# Patient Record
Sex: Female | Born: 1975
Health system: Southern US, Community
[De-identification: ages and names within clinical notes are randomized; demographics above are authoritative.]

## PROBLEM LIST (undated history)

## (undated) DIAGNOSIS — R3129 Other microscopic hematuria: Secondary | ICD-10-CM

## (undated) DIAGNOSIS — R05 Cough: Secondary | ICD-10-CM

## (undated) DIAGNOSIS — J45909 Unspecified asthma, uncomplicated: Secondary | ICD-10-CM

## (undated) DIAGNOSIS — M79673 Pain in unspecified foot: Secondary | ICD-10-CM

## (undated) DIAGNOSIS — E079 Disorder of thyroid, unspecified: Secondary | ICD-10-CM

## (undated) DIAGNOSIS — R059 Cough, unspecified: Secondary | ICD-10-CM

## (undated) DIAGNOSIS — D229 Melanocytic nevi, unspecified: Secondary | ICD-10-CM

## (undated) DIAGNOSIS — R42 Dizziness and giddiness: Secondary | ICD-10-CM

## (undated) DIAGNOSIS — E063 Autoimmune thyroiditis: Secondary | ICD-10-CM

## (undated) DIAGNOSIS — E559 Vitamin D deficiency, unspecified: Secondary | ICD-10-CM

## (undated) DIAGNOSIS — R5383 Other fatigue: Secondary | ICD-10-CM

## (undated) DIAGNOSIS — F419 Anxiety disorder, unspecified: Secondary | ICD-10-CM

## (undated) DIAGNOSIS — E039 Hypothyroidism, unspecified: Secondary | ICD-10-CM

## (undated) DIAGNOSIS — J449 Chronic obstructive pulmonary disease, unspecified: Secondary | ICD-10-CM

## (undated) DIAGNOSIS — R109 Unspecified abdominal pain: Secondary | ICD-10-CM

## (undated) DIAGNOSIS — M25461 Effusion, right knee: Secondary | ICD-10-CM

## (undated) DIAGNOSIS — D2330 Other benign neoplasm of skin of unspecified part of face: Secondary | ICD-10-CM

## (undated) DIAGNOSIS — M25561 Pain in right knee: Secondary | ICD-10-CM

## (undated) DIAGNOSIS — E049 Nontoxic goiter, unspecified: Secondary | ICD-10-CM

## (undated) DIAGNOSIS — R350 Frequency of micturition: Secondary | ICD-10-CM

## (undated) DIAGNOSIS — R0602 Shortness of breath: Secondary | ICD-10-CM

## (undated) HISTORY — DX: Vitamin D deficiency, unspecified: E55.9

## (undated) HISTORY — DX: Other microscopic hematuria: R31.29

## (undated) HISTORY — DX: Pain in right knee: M25.461

## (undated) HISTORY — DX: Unspecified abdominal pain: R10.9

## (undated) HISTORY — DX: Shortness of breath: R06.02

## (undated) HISTORY — DX: Cough, unspecified: R05.9

## (undated) HISTORY — DX: Disorder of thyroid, unspecified: E07.9

## (undated) HISTORY — DX: Frequency of micturition: R35.0

## (undated) HISTORY — DX: Anxiety disorder, unspecified: F41.9

## (undated) HISTORY — DX: Other benign neoplasm of skin of unspecified part of face: D23.30

## (undated) HISTORY — DX: Dizziness and giddiness: R42

## (undated) HISTORY — DX: Other fatigue: R53.83

## (undated) HISTORY — PX: OOPHORECTOMY: SHX86

## (undated) HISTORY — DX: Pain in right knee: M25.561

## (undated) HISTORY — DX: Nontoxic goiter, unspecified: E04.9

## (undated) HISTORY — DX: Cough: R05

## (undated) HISTORY — DX: Melanocytic nevi, unspecified: D22.9

## (undated) HISTORY — PX: CHOLECYSTECTOMY: SHX55

## (undated) HISTORY — DX: Pain in unspecified foot: M79.673

## (undated) HISTORY — DX: Autoimmune thyroiditis: E06.3

## (undated) HISTORY — DX: Chronic obstructive pulmonary disease, unspecified: J44.9

## (undated) HISTORY — DX: Hypothyroidism, unspecified: E03.9

---

## 2011-04-25 DIAGNOSIS — F411 Generalized anxiety disorder: Secondary | ICD-10-CM | POA: Insufficient documentation

## 2011-04-25 DIAGNOSIS — E039 Hypothyroidism, unspecified: Secondary | ICD-10-CM | POA: Insufficient documentation

## 2011-10-27 DIAGNOSIS — G43909 Migraine, unspecified, not intractable, without status migrainosus: Secondary | ICD-10-CM | POA: Insufficient documentation

## 2011-10-27 DIAGNOSIS — K219 Gastro-esophageal reflux disease without esophagitis: Secondary | ICD-10-CM | POA: Insufficient documentation

## 2015-03-13 ENCOUNTER — Encounter: Payer: Self-pay | Admitting: Obstetrics and Gynecology

## 2015-03-13 ENCOUNTER — Ambulatory Visit (INDEPENDENT_AMBULATORY_CARE_PROVIDER_SITE_OTHER): Payer: Managed Care, Other (non HMO) | Admitting: Obstetrics and Gynecology

## 2015-03-13 VITALS — BP 108/71 | HR 84 | Resp 16 | Ht 64.0 in | Wt 175.0 lb

## 2015-03-13 DIAGNOSIS — R3129 Other microscopic hematuria: Secondary | ICD-10-CM

## 2015-03-13 DIAGNOSIS — R312 Other microscopic hematuria: Secondary | ICD-10-CM | POA: Diagnosis not present

## 2015-03-13 DIAGNOSIS — R350 Frequency of micturition: Secondary | ICD-10-CM | POA: Diagnosis not present

## 2015-03-13 LAB — MICROSCOPIC EXAMINATION: Epithelial Cells (non renal): >10 /hpf — ABNORMAL HIGH (ref 0–10)

## 2015-03-13 LAB — URINALYSIS, COMPLETE
Bilirubin, UA: NEGATIVE
Glucose, UA: NEGATIVE
Ketones, UA: NEGATIVE
LEUKOCYTES UA: NEGATIVE
NITRITE UA: NEGATIVE
PH UA: 5 (ref 5.0–7.5)
Protein, UA: NEGATIVE
RBC, UA: NEGATIVE
Specific Gravity, UA: >1.03 — ABNORMAL HIGH (ref 1.005–1.030)
Urobilinogen, Ur: 1 mg/dL (ref 0.2–1.0)

## 2015-03-13 LAB — BLADDER SCAN AMB NON-IMAGING

## 2015-03-13 NOTE — Progress Notes (Signed)
03/13/2015 10:54 AM   Meagan Savage 25-Jul-1975 [id number]  Referring provider: No referring provider defined for this encounter.  Chief Complaint  Patient presents with  . Urinary Frequency  . Establish Care    HPI: Patient is a 39 year old female presenting as a referral from her primary care provider for urinary frequency and microscopic hematuria.  1. Microscopic Hematuria- Per referral notes patient had multiple episodes of microscopic hematuria with negative urine cultures. No urinalysis were sent with referral notes. No gross hematuria.  2. Urinary Frequency/Incontinence- Patient reports symptoms of urinary frequency, small volume voids, mild dysuria, urge/stress incontinence and vaginal pressure 1 year. History of 3 pregnancies with 2 vaginal deliveries and one cesarean section. Patient reports daytime frequency approximately every 1 hour with urinary leakage when she is at work and is not able to void for up to 3 hours as well as urine leakage with cough. Associated symptoms include intermittent dysuria. Patient reports that she drinks very little water daily.   PMH: Past Medical History  Diagnosis Date  . Anxiety   . Cyst, dermoid, face   . Pain and swelling of right knee   . Goiter   . Cough   . Nevus   . Hypothyroidism   . Vitamin D deficiency   . Shortness of breath at rest   . Microscopic hematuria   . Fatigue   . Acute foot pain   . Abdominal pain   . Urinary frequency   . Dizziness     Surgical History: Past Surgical History  Procedure Laterality Date  . Cesarean section    . Cholecystectomy      Home Medications:    Medication List       This list is accurate as of: 03/13/15 10:54 AM.  Always use your most recent med list.               albuterol 108 (90 BASE) MCG/ACT inhaler  Commonly known as:  PROVENTIL HFA;VENTOLIN HFA  Inhale 2 puffs into the lungs every 6 (six) hours as needed for wheezing or shortness of breath.     ALPRAZolam 0.25 MG tablet  Commonly known as:  XANAX  Take 0.25 mg by mouth at bedtime as needed for anxiety.     levonorgestrel 20 MCG/24HR IUD  Commonly known as:  MIRENA  1 each by Intrauterine route once.     nicotine 21 mg/24hr patch  Commonly known as:  NICODERM CQ - dosed in mg/24 hours  Place 21 mg onto the skin daily.     sertraline 100 MG tablet  Commonly known as:  ZOLOFT  Take 100 mg by mouth daily.     SPIRIVA HANDIHALER 18 MCG inhalation capsule  Generic drug:  tiotropium  Place 18 mcg into inhaler and inhale daily.     thyroid 90 MG tablet  Commonly known as:  ARMOUR  Take 90 mg by mouth daily.     thyroid 60 MG tablet  Commonly known as:  ARMOUR  Take 60 mg by mouth daily before breakfast.        Allergies:  Allergies  Allergen Reactions  . Chantix [Varenicline] Nausea Only  . Levaquin [Levofloxacin In D5w] Other (See Comments)    arthralgia  . Penicillins Hives    Family History: Family History  Problem Relation Age of Onset  . Thyroid disease Mother   . Thyroid disease Sister   . COPD Mother   . COPD Father     Social History:  reports that she has been smoking Cigarettes.  She has been smoking about 0.75 packs per day. She does not have any smokeless tobacco history on file. She reports that she drinks alcohol. She reports that she does not use illicit drugs.  ROS: UROLOGY Frequent Urination?: Yes Hard to postpone urination?: Yes Burning/pain with urination?: Yes Get up at night to urinate?: No Leakage of urine?: Yes Urine stream starts and stops?: Yes Trouble starting stream?: Yes Do you have to strain to urinate?: Yes Blood in urine?: Yes Urinary tract infection?: No Sexually transmitted disease?: No Injury to kidneys or bladder?: No Painful intercourse?: No Weak stream?: No Currently pregnant?: No Vaginal bleeding?: No Last menstrual period?: n   Gastrointestinal Nausea?: No Vomiting?: No Indigestion/heartburn?:  No Diarrhea?: No Constipation?: No  Constitutional Fever: No Night sweats?: No Weight loss?: No Fatigue?: Yes  Skin Skin rash/lesions?: No Itching?: No  Eyes Blurred vision?: No Double vision?: No  Ears/Nose/Throat Sore throat?: No Sinus problems?: No  Hematologic/Lymphatic Swollen glands?: No Easy bruising?: Yes  Cardiovascular Leg swelling?: No Chest pain?: No  Respiratory Cough?: Yes Shortness of breath?: Yes  Endocrine Excessive thirst?: Yes  Musculoskeletal Back pain?: No Joint pain?: No  Neurological Headaches?: No Dizziness?: No  Psychologic Depression?: Yes Anxiety?: No  Physical Exam: BP 108/71 mmHg  Pulse 84  Resp 16  Ht 5\' 4"  (1.626 m)  Wt 175 lb (79.379 kg)  BMI 30.02 kg/m2  Constitutional:  Alert and oriented, No acute distress. HEENT: [place] AT, moist mucus membranes.  Trachea midline, no masses. Cardiovascular: No clubbing, cyanosis, or edema. Respiratory: Normal respiratory effort, no increased work of breathing. GI: Abdomen is soft, nontender, nondistended, no abdominal masses GU: No CVA tenderness.  No vaginal lesions or discharge. Normal urethral meatus. No urethral discharge, masses or tenderness. Normal anus and perineum.  Vaginal mucosa pink, with good moisture and normal rugae.  No tenderness or inflammation. Grade 1-2 cystocele with Valsalva, Grade 1 rectocele Minimal urethral hypermobility without demonstrable SUI.   Cervix present and normal in appearance.  No discharge or motion tenderness. No adnexal tenderness or palpable masses.   Skin: No rashes, bruises or suspicious lesions. Lymph: No cervical or inguinal adenopathy. Neurologic: Grossly intact, no focal deficits, moving all 4 extremities. Psychiatric: Normal mood and affect.  Laboratory Data: No results found for: WBC, HGB, HCT, MCV, PLT  No results found for: CREATININE  No results found for: PSA  No results found for: TESTOSTERONE  No results found  for: HGBA1C  Urinalysis No results found for: COLORURINE, APPEARANCEUR, LABSPEC, PHURINE, GLUCOSEU, HGBUR, BILIRUBINUR, KETONESUR, PROTEINUR, UROBILINOGEN, NITRITE, LEUKOCYTESUR  Pertinent Imaging:   Assessment & Plan:    1. Urinary frequency- Urinary frequency upon approximately every 1 hour daily with associated urge incontinence when she is unable to void for 2-3 hours. Symptoms consistent with overactive bladder. PVR 103 ML's a day. We'll start trial of Vesicare and have patient return in 2 weeks for nurse visit PVR. Patient does report Baseline constipation. I suggested patient take daily stool softeners as well as increase her water intake to improve bowel function. Moderate bacteria seen on urinalysis.  Urine sent for culture. I will have her come back in 1 month for recheck. - Urinalysis, Complete - Urine Culture - BLADDER SCAN AMB NON-IMAGING  2. Microscopic Hematuria- Per referral notes patient had multiple episodes of microscopic hematuria with negative urine cultures. No urinalysis were sent with referral notes. No microscopic hematuria on today's micro-urinalysis. I have requested notes from patient's  previous provider. Patient does have a history of smoking and will warrant a microscopic hematuria workup if previous  Micro urinalysis were positive.  3. Dysuria- I suspect intermittent dysuria related to patient's highly concentrated urine due to dehydration. I recommend significantly increasing her daily water intake. We'll recheck symptoms in 1 month.  4. Cystocele-  Grade 1-2 cystocele.  PVR 110ml.  Will continue to monitor for retention.  Patient may need to see Gyn in the future.  Additional notes and/or imaging study requested from PCP   Return for 2 weeks PVR nurse visit; 1 month with Ria Comment recheck OAB/micro heme/UAs requested from PCP.  Herbert Moors, [facility] [address], [place], [place] [address] [phone]

## 2015-03-15 LAB — CULTURE, URINE COMPREHENSIVE

## 2015-03-19 ENCOUNTER — Telehealth: Payer: Self-pay | Admitting: *Deleted

## 2015-03-19 NOTE — Telephone Encounter (Signed)
-----   Message from Roda Shutters, [place] sent at 03/19/2015  4:51 PM EDT ----- Please notify patient that her urine culture did show a small amount of bacteria though it is not clinically significant for urinary tract infection. If she is still continuing to have significant urinary symptoms she needs to come in and drop off another urine specimen to be sent for culture. Thanks

## 2015-03-19 NOTE — Telephone Encounter (Signed)
LMOM for patient to return call.

## 2015-03-20 ENCOUNTER — Other Ambulatory Visit: Payer: Managed Care, Other (non HMO)

## 2015-03-20 DIAGNOSIS — R300 Dysuria: Secondary | ICD-10-CM

## 2015-03-20 LAB — URINALYSIS, COMPLETE
BILIRUBIN UA: NEGATIVE
GLUCOSE, UA: NEGATIVE
KETONES UA: NEGATIVE
NITRITE UA: NEGATIVE
Protein, UA: NEGATIVE
RBC UA: NEGATIVE
SPEC GRAV UA: 1.025 (ref 1.005–1.030)
Urobilinogen, Ur: 1 mg/dL (ref 0.2–1.0)
pH, UA: 5.5 (ref 5.0–7.5)

## 2015-03-20 LAB — MICROSCOPIC EXAMINATION

## 2015-03-20 NOTE — Telephone Encounter (Signed)
Spoke with patient she is still burning she is coming in today around 2:00 to drop off another UA for culture.  Thanks, Sharyn Lull

## 2015-03-22 LAB — CULTURE, URINE COMPREHENSIVE

## 2015-03-27 ENCOUNTER — Ambulatory Visit (INDEPENDENT_AMBULATORY_CARE_PROVIDER_SITE_OTHER): Payer: Managed Care, Other (non HMO)

## 2015-03-27 ENCOUNTER — Telehealth: Payer: Self-pay

## 2015-03-27 DIAGNOSIS — N3281 Overactive bladder: Secondary | ICD-10-CM | POA: Diagnosis not present

## 2015-03-27 DIAGNOSIS — N390 Urinary tract infection, site not specified: Secondary | ICD-10-CM

## 2015-03-27 LAB — BLADDER SCAN AMB NON-IMAGING: SCAN RESULT: 116

## 2015-03-27 MED ORDER — SULFAMETHOXAZOLE-TRIMETHOPRIM 800-160 MG PO TABS: 1.0000 | ORAL_TABLET | Freq: Two times a day (BID) | ORAL | Status: AC

## 2015-03-27 NOTE — Progress Notes (Signed)
Bladder Scan: 116 Patient can void: amount not measured  Performed By: Toniann Fail, LPN   While pt was in office made aware of urine cx results. Medication called into pharmacy.

## 2015-03-27 NOTE — Telephone Encounter (Signed)
Pt came into office today for PVR. Made pt aware of results at that time. Medication called into pt pharmacy.

## 2015-04-16 ENCOUNTER — Ambulatory Visit (INDEPENDENT_AMBULATORY_CARE_PROVIDER_SITE_OTHER): Payer: Managed Care, Other (non HMO) | Admitting: Obstetrics and Gynecology

## 2015-04-16 ENCOUNTER — Encounter: Payer: Self-pay | Admitting: Obstetrics and Gynecology

## 2015-04-16 VITALS — BP 122/84 | HR 80 | Ht 64.0 in | Wt 173.3 lb

## 2015-04-16 DIAGNOSIS — R350 Frequency of micturition: Secondary | ICD-10-CM

## 2015-04-16 DIAGNOSIS — R3129 Other microscopic hematuria: Secondary | ICD-10-CM | POA: Diagnosis not present

## 2015-04-16 DIAGNOSIS — N39498 Other specified urinary incontinence: Secondary | ICD-10-CM

## 2015-04-16 LAB — URINALYSIS, COMPLETE
Bilirubin, UA: NEGATIVE
GLUCOSE, UA: NEGATIVE
KETONES UA: NEGATIVE
NITRITE UA: NEGATIVE
UUROB: 1 mg/dL (ref 0.2–1.0)
pH, UA: 5.5 (ref 5.0–7.5)

## 2015-04-16 LAB — MICROSCOPIC EXAMINATION
EPITHELIAL CELLS (NON RENAL): NONE SEEN /HPF (ref 0–10)
RENAL EPITHEL UA: NONE SEEN /HPF
WBC, UA: NONE SEEN /hpf (ref 0–?)

## 2015-04-16 LAB — BLADDER SCAN AMB NON-IMAGING

## 2015-04-16 NOTE — Progress Notes (Signed)
04/16/2015 8:55 AM   Meagan Savage 1975-07-07 [id number]  Referring provider: No referring provider defined for this encounter.  Chief Complaint  Patient presents with  . Urinary Frequency    HPI: Patient presents today for follow up on urinary frequency, incontinence and microscopic hematuria.  1. Microscopic Hematuria- Per referral notes patient had multiple episodes of microscopic hematuria with negative urine cultures. No urinalysis were sent with referral notes. No gross hematuria.  Lab results were requested but we have been unable to obtain previous UAs.  2. Urinary Frequency/Incontinence- Patient reports symptoms of urinary frequency, small volume voids, mild dysuria, urge/stress incontinence and vaginal pressure 1 year. History of 3 pregnancies with 2 vaginal deliveries and one cesarean section. Patient reports daytime frequency has significantly improved on Vesicare. Intermittent dysuria resolved. Patient reports that she drinks very little water daily.  She has continued to experience leakage while working long hours standing up. She does not leak when she is at home.  PMH: Past Medical History  Diagnosis Date  . Anxiety   . Cyst, dermoid, face   . Pain and swelling of right knee   . Goiter   . Cough   . Nevus   . Hypothyroidism   . Vitamin D deficiency   . Shortness of breath at rest   . Microscopic hematuria   . Fatigue   . Acute foot pain   . Abdominal pain   . Urinary frequency   . Dizziness     Surgical History: Past Surgical History  Procedure Laterality Date  . Cesarean section    . Cholecystectomy      Home Medications:    Medication List       This list is accurate as of: 04/16/15  8:55 AM.  Always use your most recent med list.               albuterol 108 (90 BASE) MCG/ACT inhaler  Commonly known as:  PROVENTIL HFA;VENTOLIN HFA  Inhale 2 puffs into the lungs every 6 (six) hours as needed for wheezing or shortness of breath.     ALPRAZolam 0.25 MG tablet  Commonly known as:  XANAX  Take 0.25 mg by mouth at bedtime as needed for anxiety.     levonorgestrel 20 MCG/24HR IUD  Commonly known as:  MIRENA  1 each by Intrauterine route once.     nicotine 21 mg/24hr patch  Commonly known as:  NICODERM CQ - dosed in mg/24 hours  Place 21 mg onto the skin daily.     sertraline 100 MG tablet  Commonly known as:  ZOLOFT  Take 100 mg by mouth daily.     solifenacin 10 MG tablet  Commonly known as:  VESICARE  Take by mouth daily.     SPIRIVA HANDIHALER 18 MCG inhalation capsule  Generic drug:  tiotropium  Place 18 mcg into inhaler and inhale daily.     thyroid 90 MG tablet  Commonly known as:  ARMOUR  Take 90 mg by mouth daily.        Allergies:  Allergies  Allergen Reactions  . Chantix [Varenicline] Nausea Only  . Levaquin [Levofloxacin In D5w] Other (See Comments)    arthralgia  . Penicillins Hives    Family History: Family History  Problem Relation Age of Onset  . Thyroid disease Mother   . Thyroid disease Sister   . COPD Mother   . COPD Father     Social History:  reports that she has been smoking  Cigarettes.  She has been smoking about 0.75 packs per day. She does not have any smokeless tobacco history on file. She reports that she drinks alcohol. She reports that she does not use illicit drugs.  ROS: UROLOGY Frequent Urination?: No Hard to postpone urination?: Yes Burning/pain with urination?: No Get up at night to urinate?: No Leakage of urine?: Yes Urine stream starts and stops?: No Trouble starting stream?: No Do you have to strain to urinate?: No Blood in urine?: No Urinary tract infection?: No Sexually transmitted disease?: No Injury to kidneys or bladder?: No Painful intercourse?: No Weak stream?: No Currently pregnant?: No Vaginal bleeding?: No Last menstrual period?: n  Gastrointestinal Nausea?: No Vomiting?: No Indigestion/heartburn?: No Diarrhea?:  No Constipation?: No  Constitutional Fever: No Night sweats?: No Weight loss?: No Fatigue?: No  Skin Skin rash/lesions?: No Itching?: No  Eyes Blurred vision?: No Double vision?: No  Ears/Nose/Throat Sore throat?: No Sinus problems?: No  Hematologic/Lymphatic Swollen glands?: No Easy bruising?: No  Cardiovascular Leg swelling?: No Chest pain?: No  Respiratory Cough?: No Shortness of breath?: No  Endocrine Excessive thirst?: No  Musculoskeletal Back pain?: No Joint pain?: No  Neurological Headaches?: No Dizziness?: No  Psychologic Depression?: No Anxiety?: No  Physical Exam: BP 122/84 mmHg  Pulse 80  Ht 5\' 4"  (1.626 m)  Wt 173 lb 4.8 oz (78.608 kg)  BMI 29.73 kg/m2  Constitutional:  Alert and oriented, No acute distress. HEENT: [place] AT, moist mucus membranes.  Trachea midline, no masses. Cardiovascular: No clubbing, cyanosis, or edema. Respiratory: Normal respiratory effort, no increased work of breathing. Skin: No rashes, bruises or suspicious lesions. Lymph: No cervical or inguinal adenopathy. Neurologic: Grossly intact, no focal deficits, moving all 4 extremities. Psychiatric: Normal mood and affect.  Laboratory Data: No results found for: WBC, HGB, HCT, MCV, PLT  No results found for: CREATININE  No results found for: PSA  No results found for: TESTOSTERONE  No results found for: HGBA1C  Urinalysis    Component Value Date/Time   GLUCOSEU Negative 03/20/2015 1408   BILIRUBINUR Negative 03/20/2015 1408   NITRITE Negative 03/20/2015 1408   LEUKOCYTESUR Trace* 03/20/2015 1408    Pertinent Imaging:   Assessment & Plan:    1. Urinary frequency/Incontinence-  Frequency improved on Vesicare. Continued incontinence while standing and working for long periods of time. UDS discussed for further evaluation of patient's leakage.  Patient would like to revisit in 6 months. - Urinalysis, Complete - BLADDER SCAN AMB NON-IMAGING  2.  Microscopic hematuria- We discussed the differential diagnosis for microscopic hematuria including nephrolithiasis, renal or upper tract tumors, bladder stones, UTIs, or bladder tumors as well as undetermined etiologies. Per AUA guidelines, I did recommend complete microscopic hematuria evaluation including CTU, possible urine cytology, and office cystoscopy.  We have been unable to obtain patient's previous UAs that were supposedly positive for hematuria.  Considering her smoking history we discussed the risk of possible malignancy being missed at this time.  Patient would like to pursue complete hematuria workup.  Return for f/u for CT results; cystoscopy.  Herbert Moors, [facility] [address] [place], [place] [address] [phone]

## 2015-04-17 LAB — PREGNANCY, URINE: PREG TEST UR: NEGATIVE

## 2015-04-24 ENCOUNTER — Ambulatory Visit
Admission: RE | Admit: 2015-04-24 | Discharge: 2015-04-24 | Disposition: A | Discharge status: Home / Self Care | Payer: Managed Care, Other (non HMO) | Source: Ambulatory Visit | Attending: Obstetrics and Gynecology | Admitting: Obstetrics and Gynecology

## 2015-04-24 DIAGNOSIS — R3129 Other microscopic hematuria: Secondary | ICD-10-CM | POA: Insufficient documentation

## 2015-04-24 DIAGNOSIS — N839 Noninflammatory disorder of ovary, fallopian tube and broad ligament, unspecified: Secondary | ICD-10-CM | POA: Diagnosis not present

## 2015-04-24 HISTORY — DX: Unspecified asthma, uncomplicated: J45.909

## 2015-04-24 MED ORDER — IOHEXOL 300 MG/ML  SOLN
150.0000 mL | Freq: Once | INTRAMUSCULAR | Status: AC | PRN
  Administered 2015-04-24: 150 mL via INTRAVENOUS

## 2015-05-01 ENCOUNTER — Encounter: Payer: Self-pay | Admitting: Urology

## 2015-05-01 ENCOUNTER — Ambulatory Visit (INDEPENDENT_AMBULATORY_CARE_PROVIDER_SITE_OTHER): Payer: Managed Care, Other (non HMO) | Admitting: Urology

## 2015-05-01 VITALS — BP 122/84 | HR 88 | Ht 64.0 in | Wt 179.8 lb

## 2015-05-01 DIAGNOSIS — N39498 Other specified urinary incontinence: Secondary | ICD-10-CM

## 2015-05-01 DIAGNOSIS — R350 Frequency of micturition: Secondary | ICD-10-CM

## 2015-05-01 DIAGNOSIS — Z720 Tobacco use: Secondary | ICD-10-CM

## 2015-05-01 DIAGNOSIS — R3129 Other microscopic hematuria: Secondary | ICD-10-CM

## 2015-05-01 DIAGNOSIS — N83201 Unspecified ovarian cyst, right side: Secondary | ICD-10-CM

## 2015-05-01 LAB — URINALYSIS, COMPLETE
BILIRUBIN UA: NEGATIVE
GLUCOSE, UA: NEGATIVE
KETONES UA: NEGATIVE
Leukocytes, UA: NEGATIVE
Nitrite, UA: NEGATIVE
PROTEIN UA: NEGATIVE
SPEC GRAV UA: 1.025 (ref 1.005–1.030)
UUROB: 1 mg/dL (ref 0.2–1.0)
pH, UA: 5.5 (ref 5.0–7.5)

## 2015-05-01 LAB — MICROSCOPIC EXAMINATION

## 2015-05-01 MED ORDER — LIDOCAINE HCL 2 % EX GEL
1.0000 "application " | Freq: Once | CUTANEOUS | Status: AC
  Administered 2015-05-01: 1 via URETHRAL

## 2015-05-01 MED ORDER — SULFAMETHOXAZOLE-TRIMETHOPRIM 800-160 MG PO TABS
1.0000 | ORAL_TABLET | Freq: Once | ORAL | Status: AC
  Administered 2015-05-01: 1 via ORAL

## 2015-05-01 NOTE — Progress Notes (Signed)
9:00 AM   Meagan Savage Aug 01, 1975 [id number]  Referring provider: No referring provider defined for this encounter.  Chief Complaint  Patient presents with  . Cysto    HPI: 39 year old female who presents today for cystoscopy for completion of her microscopic hematuria workup.  CT urogram was performed in the interim which shows a right ovarian cyst, otherwise unremarkable for any GU pathology.  She also has a history of urinary frequency and incontinence (mixed), and has been started on Vesicare. urinary frequency, incontinence and microscopic hematuria.    PMH: Past Medical History  Diagnosis Date  . Anxiety   . Cyst, dermoid, face   . Pain and swelling of right knee   . Goiter   . Cough   . Nevus   . Hypothyroidism   . Vitamin D deficiency   . Shortness of breath at rest   . Microscopic hematuria   . Fatigue   . Acute foot pain   . Abdominal pain   . Urinary frequency   . Dizziness   . Asthma     Surgical History: Past Surgical History  Procedure Laterality Date  . Cesarean section    . Cholecystectomy      Home Medications:    Medication List       This list is accurate as of: 05/01/15  9:00 AM.  Always use your most recent med list.               albuterol 108 (90 BASE) MCG/ACT inhaler  Commonly known as:  PROVENTIL HFA;VENTOLIN HFA  Inhale 2 puffs into the lungs every 6 (six) hours as needed for wheezing or shortness of breath.     ALPRAZolam 0.25 MG tablet  Commonly known as:  XANAX  Take 0.25 mg by mouth at bedtime as needed for anxiety.     levonorgestrel 20 MCG/24HR IUD  Commonly known as:  MIRENA  1 each by Intrauterine route once.     nicotine 21 mg/24hr patch  Commonly known as:  NICODERM CQ - dosed in mg/24 hours  Place 21 mg onto the skin daily.     sertraline 100 MG tablet  Commonly known as:  ZOLOFT  Take 100 mg by mouth daily.     solifenacin 10 MG tablet  Commonly known as:  VESICARE  Take by mouth daily.       SPIRIVA HANDIHALER 18 MCG inhalation capsule  Generic drug:  tiotropium  Place 18 mcg into inhaler and inhale daily.     thyroid 90 MG tablet  Commonly known as:  ARMOUR  Take 90 mg by mouth daily.        Allergies:  Allergies  Allergen Reactions  . Chantix [Varenicline] Nausea Only  . Levaquin [Levofloxacin In D5w] Other (See Comments)    arthralgia  . Penicillins Hives    Family History: Family History  Problem Relation Age of Onset  . Thyroid disease Mother   . Thyroid disease Sister   . COPD Mother   . COPD Father     Social History:  reports that she has been smoking Cigarettes.  She has been smoking about 0.75 packs per day. She does not have any smokeless tobacco history on file. She reports that she drinks alcohol. She reports that she does not use illicit drugs.   Physical Exam: BP 122/84 mmHg  Pulse 88  Ht 5\' 4"  (1.626 m)  Wt 179 lb 12.8 oz (81.557 kg)  BMI 30.85 kg/m2  Constitutional:  Alert and oriented, No acute distress. HEENT: [place] AT, moist mucus membranes.  Trachea midline, no masses. Cardiovascular: No clubbing, cyanosis, or edema. Respiratory: Normal respiratory effort, no increased work of breathing. GU: Normal external genitalia. Normal urethral meatus. Skin: No rashes, bruises or suspicious lesions. Neurologic: Grossly intact, no focal deficits, moving all 4 extremities. Psychiatric: Normal mood and affect.  Urinalysis    Component Value Date/Time   GLUCOSEU Negative 04/16/2015 0829   BILIRUBINUR Negative 04/16/2015 0829   NITRITE Negative 04/16/2015 0829   LEUKOCYTESUR Trace* 04/16/2015 0829    Pertinent Imaging: CLINICAL DATA: Dysuria for 2 months with urinary leakage for 2 years. Intrauterine device and cholecystectomy. Microscopic hematuria.  EXAM: CT ABDOMEN AND PELVIS WITHOUT AND WITH CONTRAST  TECHNIQUE: Multidetector CT imaging of the abdomen and pelvis was performed following the standard protocol before and  following the bolus administration of intravenous contrast.  CONTRAST: 143mL OMNIPAQUE IOHEXOL 300 MG/ML SOLN  COMPARISON: None.  FINDINGS: Lower chest: Bibasilar and lingular subsegmental atelectasis. Normal heart size without pericardial or pleural effusion.  Hepatobiliary: Normal liver. Cholecystectomy, without biliary ductal dilatation.  Pancreas: Normal, without mass or ductal dilatation.  Spleen: Normal in size, without focal abnormality.  Adrenals/Urinary Tract: Normal adrenal glands. No renal calculi or hydronephrosis. No hydroureter or ureteric calculi. No bladder calculi. No renal mass on post-contrast images. Moderate renal collecting system opacification on delayed images. Moderate ureteric opacification, without filling defect. No filling defect within the urinary bladder on moderately well opacified post-contrast images.  Stomach/Bowel: Normal stomach, without wall thickening. Normal colon and terminal ileum. Normal appendix (image 50, series 8). Normal small bowel.  Vascular/Lymphatic: Normal caliber of the aorta and branch vessels. No abdominopelvic adenopathy.  Reproductive: Intrauterine device. Multiple adjacent right ovarian simple cystic lesions versus 1 multi septated lesion. In conglomerate, measures 6.4 x 4.8 cm.  Other: No significant free fluid. cholecystectomy clip within the anterior left pelvis (image 51, series 8).  Musculoskeletal: No acute osseous abnormality.  IMPRESSION: 1. No acute process or explanation for hematuria. 2. Multiple right ovarian cysts versus 1 multiseptated cystic lesion. This warrants further evaluation with pelvic ultrasound.   Electronically Signed  By: Abigail Miyamoto M.D.  On: 04/24/2015 10:37           Vitals     Height Weight BMI (Calculated)    5\' 4"  (1.626 m) 179 lb 12.8 oz (81.557 kg) 30.9      Interpretation Summary     CLINICAL DATA: Dysuria for 2 months with  urinary leakage for 2 years. Intrauterine device and cholecystectomy. Microscopic hematuria.  EXAM: CT ABDOMEN AND PELVIS WITHOUT AND WITH CONTRAST  TECHNIQUE: Multidetector CT imaging of the abdomen and pelvis was performed following the standard protocol before and following the bolus administration of intravenous contrast.  CONTRAST: 151mL OMNIPAQUE IOHEXOL 300 MG/ML SOLN  COMPARISON: None.  FINDINGS: Lower chest: Bibasilar and lingular subsegmental atelectasis. Normal heart size without pericardial or pleural effusion.  Hepatobiliary: Normal liver. Cholecystectomy, without biliary ductal dilatation.  Pancreas: Normal, without mass or ductal dilatation.  Spleen: Normal in size, without focal abnormality.  Adrenals/Urinary Tract: Normal adrenal glands. No renal calculi or hydronephrosis. No hydroureter or ureteric calculi. No bladder calculi. No renal mass on post-contrast images. Moderate renal collecting system opacification on delayed images. Moderate ureteric opacification, without filling defect. No filling defect within the urinary bladder on moderately well opacified post-contrast images.  Stomach/Bowel: Normal stomach, without wall thickening. Normal colon and terminal ileum. Normal appendix (image 50, series 8). Normal small  bowel.  Vascular/Lymphatic: Normal caliber of the aorta and branch vessels. No abdominopelvic adenopathy.  Reproductive: Intrauterine device. Multiple adjacent right ovarian simple cystic lesions versus 1 multi septated lesion. In conglomerate, measures 6.4 x 4.8 cm.  Other: No significant free fluid. cholecystectomy clip within the anterior left pelvis (image 51, series 8).  Musculoskeletal: No acute osseous abnormality.  IMPRESSION: 1. No acute process or explanation for hematuria. 2. Multiple right ovarian cysts versus 1 multiseptated cystic lesion. This warrants further evaluation with pelvic  ultrasound.   Electronically Signed  By: Abigail Miyamoto M.D.  On: 04/24/2015 10:37      Cystoscopy Procedure Note  Patient identification was confirmed, informed consent was obtained, and patient was prepped using Betadine solution.  Lidocaine jelly was administered per urethral meatus.    Preoperative abx where received prior to procedure.    Procedure: - Flexible cystoscope introduced, without any difficulty.   - Thorough search of the bladder revealed:    normal urethral meatus    normal urothelium    no stones    no ulcers     no tumors    no urethral polyps    no trabeculation  - Ureteral orifices were normal in position and appearance.  Post-Procedure: - Patient tolerated the procedure well   Assessment & Plan:       1. Microscopic hematuria S/p negative hematuria work up including cystoscopy/ CTU - Urinalysis, Complete - sulfamethoxazole-trimethoprim (BACTRIM DS,SEPTRA DS) 800-160 MG per tablet 1 tablet; Take 1 tablet by mouth once. - lidocaine (XYLOCAINE) 2 % jelly 1 application; Place 1 application into the urethra once.  2. Right ovarian cyst Incidental finding on CT urogram. Recommend further workup with pelvic ultrasound and referral to OB/GYN. Findings discussed with patient. - Ambulatory referral to Obstetrics / Gynecology - US Pelvis Complete; Future  3. Tobacco abuse Discussed the importance of cessation of tobacco use.  4. Other urinary incontinence Plan for follow-up with mid-level provider for further treatment.  5. Urinary frequency As above  Return in about 6 weeks (around 06/12/2015) for with Ria Comment for voiding issues/ f/u pelvic ultrasound.  Hollice Espy, MD  [facility] [address], [place], [place] [address] [phone]

## 2015-05-01 NOTE — Patient Instructions (Signed)
Cystoscopy  Cystoscopy is a procedure that is used to help your caregiver diagnose and sometimes treat conditions that affect your lower urinary tract. Your lower urinary tract includes your bladder and the tube through which urine passes from your bladder out of your body (urethra). Cystoscopy is performed with a thin, tube-shaped instrument (cystoscope). The cystoscope has lenses and a light at the end so that your caregiver can see inside your bladder. The cystoscope is inserted at the entrance of your urethra. Your caregiver guides it through your urethra and into your bladder. There are two main types of cystoscopy:  · Flexible cystoscopy (with a flexible cystoscope).  · Rigid cystoscopy (with a rigid cystoscope).  Cystoscopy may be recommended for many conditions, including:  · Urinary tract infections.  · Blood in your urine (hematuria).  · Loss of bladder control (urinary incontinence) or overactive bladder.  · Unusual cells found in a urine sample.  · Urinary blockage.  · Painful urination.  Cystoscopy may also be done to remove a sample of your tissue to be checked under a microscope (biopsy). It may also be done to remove or destroy bladder stones.  LET YOUR CAREGIVER KNOW ABOUT:  · Allergies to food or medicine.  · Medicines taken, including vitamins, herbs, eyedrops, over-the-counter medicines, and creams.  · Use of steroids (by mouth or creams).  · Previous problems with anesthetics or numbing medicines.  · History of bleeding problems or blood clots.  · Previous surgery.  · Other health problems, including diabetes and kidney problems.  · Possibility of pregnancy, if this applies.  PROCEDURE  The area around the opening to your urethra will be cleaned. A medicine to numb your urethra (local anesthetic) is used. If a tissue sample or stone is removed during the procedure, you may be given a medicine to make you sleep (general anesthetic).  Your caregiver will gently insert the tip of the cystoscope  into your urethra. The cystoscope will be slowly glided through your urethra and into your bladder. Sterile fluid will flow through the cystoscope and into your bladder. The fluid will expand and stretch your bladder. This gives your caregiver a better view of your bladder walls. The procedure lasts about 15-20 minutes.  AFTER THE PROCEDURE  If a local anesthetic is used, you will be allowed to go home as soon as you are ready. If a general anesthetic is used, you will be taken to a recovery area until you are stable. You may have temporary bleeding and burning on urination.     This information is not intended to replace advice given to you by your health care provider. Make sure you discuss any questions you have with your health care provider.     Document Released: 05/30/2000 Document Revised: 06/23/2014 Document Reviewed: 11/24/2011  Elsevier Interactive Patient Education ©2016 [employer]

## 2015-05-09 ENCOUNTER — Telehealth: Payer: Self-pay | Admitting: Urology

## 2015-05-09 DIAGNOSIS — N83201 Unspecified ovarian cyst, right side: Secondary | ICD-10-CM

## 2015-05-09 NOTE — Telephone Encounter (Signed)
Order only

## 2015-06-13 ENCOUNTER — Ambulatory Visit: Payer: Managed Care, Other (non HMO) | Admitting: Obstetrics and Gynecology

## 2015-06-17 HISTORY — PX: OOPHORECTOMY: SHX86

## 2015-07-18 ENCOUNTER — Emergency Department: Payer: Managed Care, Other (non HMO)

## 2015-07-18 ENCOUNTER — Emergency Department
Admission: EM | Admit: 2015-07-18 | Discharge: 2015-07-18 | Disposition: A | Discharge status: Home / Self Care | Payer: Managed Care, Other (non HMO) | Attending: Emergency Medicine | Admitting: Emergency Medicine

## 2015-07-18 ENCOUNTER — Encounter: Payer: Self-pay | Admitting: Emergency Medicine

## 2015-07-18 DIAGNOSIS — H578 Other specified disorders of eye and adnexa: Secondary | ICD-10-CM | POA: Diagnosis present

## 2015-07-18 DIAGNOSIS — F17210 Nicotine dependence, cigarettes, uncomplicated: Secondary | ICD-10-CM | POA: Insufficient documentation

## 2015-07-18 DIAGNOSIS — H538 Other visual disturbances: Secondary | ICD-10-CM | POA: Insufficient documentation

## 2015-07-18 LAB — BASIC METABOLIC PANEL
ANION GAP: 4 — AB (ref 5–15)
BUN: 10 mg/dL (ref 6–20)
CO2: 27 mmol/L (ref 22–32)
Calcium: 9.3 mg/dL (ref 8.9–10.3)
Chloride: 108 mmol/L (ref 101–111)
Creatinine, Ser: 0.79 mg/dL (ref 0.44–1.00)
GFR calc Af Amer: >60 mL/min (ref >60)
GFR calc non Af Amer: >60 mL/min (ref >60)
GLUCOSE: 100 mg/dL — AB (ref 65–99)
POTASSIUM: 3.6 mmol/L (ref 3.5–5.1)
Sodium: 139 mmol/L (ref 135–145)

## 2015-07-18 LAB — CBC
HEMATOCRIT: 42.2 % (ref 35.0–47.0)
Hemoglobin: 14.4 g/dL (ref 12.0–16.0)
MCH: 31.6 pg (ref 26.0–34.0)
MCHC: 34.1 g/dL (ref 32.0–36.0)
MCV: 92.7 fL (ref 80.0–100.0)
Platelets: 298 10*3/uL (ref 150–440)
RBC: 4.56 MIL/uL (ref 3.80–5.20)
RDW: 13.7 % (ref 11.5–14.5)
WBC: 7.8 10*3/uL (ref 3.6–11.0)

## 2015-07-18 NOTE — ED Notes (Signed)
Orders given to this RN by McLamb, RN after she spoke with Mariea Clonts, MD regarding patient's presenting c/o and triage assessment. Orders entered by this RN for: CT head, EKG, CBC, BMP.

## 2015-07-18 NOTE — ED Notes (Signed)
Blurred vision for about 2 weeks  Intermittent ..states to day sx's seem worse

## 2015-07-20 ENCOUNTER — Telehealth: Payer: Self-pay | Admitting: Emergency Medicine

## 2015-07-20 NOTE — ED Notes (Signed)
Called patient due to lwot to inquire about condition and follow up plans. Pt says she went to her doctor and they feel it may be t hyroid.

## 2015-08-22 DIAGNOSIS — E049 Nontoxic goiter, unspecified: Secondary | ICD-10-CM | POA: Insufficient documentation

## 2016-05-06 DIAGNOSIS — G5603 Carpal tunnel syndrome, bilateral upper limbs: Secondary | ICD-10-CM | POA: Insufficient documentation

## 2017-01-05 ENCOUNTER — Encounter: Payer: Self-pay | Admitting: Unknown Physician Specialty

## 2017-01-05 ENCOUNTER — Ambulatory Visit (INDEPENDENT_AMBULATORY_CARE_PROVIDER_SITE_OTHER): Payer: 59 | Admitting: Unknown Physician Specialty

## 2017-01-05 VITALS — BP 108/71 | HR 77 | Temp 98.8°F | Ht 63.8 in | Wt 147.7 lb

## 2017-01-05 DIAGNOSIS — E559 Vitamin D deficiency, unspecified: Secondary | ICD-10-CM

## 2017-01-05 DIAGNOSIS — Z7689 Persons encountering health services in other specified circumstances: Secondary | ICD-10-CM | POA: Diagnosis not present

## 2017-01-05 DIAGNOSIS — J449 Chronic obstructive pulmonary disease, unspecified: Secondary | ICD-10-CM | POA: Diagnosis not present

## 2017-01-05 DIAGNOSIS — D2330 Other benign neoplasm of skin of unspecified part of face: Secondary | ICD-10-CM

## 2017-01-05 DIAGNOSIS — E039 Hypothyroidism, unspecified: Secondary | ICD-10-CM | POA: Diagnosis not present

## 2017-01-05 NOTE — Progress Notes (Signed)
BP 108/71   Pulse 77   Temp 98.8 F (37.1 C)   Ht 5' 3.8" (1.621 m)   Wt 147 lb 11.2 oz (67 kg)   SpO2 99%   BMI 25.51 kg/m    Subjective:    [name], female    DOB: 1975-07-27, 41 y.o.   MRN: [id number]  [name] is a 41 y.o. female  Chief Complaint  Patient presents with  . Establish Care    pt states her thyroid is always up and down  . Cyst    pt states she has a knot on her chin she would like looked at, states it has been there for about 6 months    Pt is here to establish care.    Hypothyroid Thyroid issues for about 20 years.  States her thyroid has been up and down.  She went to a specialist when she was first diagnosed.  She would like to see a specialist due to the variability.    COPD Currently smokes 1/2-1 ppd.  She takes [place] daily and uses rescue inhaler about once a week.  Not ready to smoke at this time  Cyst Has a cyst on left chin for about 6 months and getting bigger  Depression screen [facility] 2/9 01/05/2017  Decreased Interest 0  Down, Depressed, Hopeless 0  PHQ - 2 Score 0  Altered sleeping 0  Tired, decreased energy 1  Change in appetite 1  Feeling bad or failure about yourself  1  Trouble concentrating 0  Moving slowly or fidgety/restless 0  Suicidal thoughts 0  PHQ-9 Score 3     Social History   Social History  . Marital status: Married    Spouse name: N/A  . Number of children: N/A  . Years of education: N/A   Occupational History  . Not on file.   Social History Main Topics  . Smoking status: Current Every Day Smoker    Packs/day: 1.00    Types: Cigarettes  . Smokeless tobacco: Never Used  . Alcohol use Yes     Comment: on rare occasion- maybe twice a year   . Drug use: No  . Sexual activity: Yes    Birth control/ protection: IUD   Other Topics Concern  . Not on file   Social History Narrative  . No narrative on file   Family History  Problem Relation Age of Onset  . COPD  Mother   . Lymphoma Mother   . Hyperlipidemia Mother   . Thyroid disease Mother   . COPD Father   . Cancer Father        blood  . Thyroid disease Sister   . Emphysema Paternal Grandfather   . Epilepsy Daughter    Past Medical History:  Diagnosis Date  . COPD (chronic obstructive pulmonary disease) ([place])   . Hashimoto's disease   . Thyroid disease    hypothyroid   Past Surgical History:  Procedure Laterality Date  . CHOLECYSTECTOMY     1995  . OOPHORECTOMY Right 2017       Relevant past medical, surgical, family and social history reviewed and updated as indicated. Interim medical history since our last visit reviewed. Allergies and medications reviewed and updated.  Review of Systems  Per HPI unless specifically indicated above     Objective:    BP 108/71   Pulse 77   Temp 98.8 F (37.1 C)   Ht 5' 3.8" (1.621 m)  Wt 147 lb 11.2 oz (67 kg)   SpO2 99%   BMI 25.51 kg/m   Wt Readings from Last 3 Encounters:  01/05/17 147 lb 11.2 oz (67 kg)    Physical Exam  Constitutional: She is oriented to person, place, and time. She appears well-developed and well-nourished. No distress.  HENT:  Head: Normocephalic and atraumatic.  Eyes: Conjunctivae and lids are normal. Right eye exhibits no discharge. Left eye exhibits no discharge. No scleral icterus.  Neck: Normal range of motion. Neck supple. No JVD present. Carotid bruit is not present.  Smooth and freely moveable nodule left chin under mandible  Cardiovascular: Normal rate, regular rhythm and normal heart sounds.   Pulmonary/Chest: Effort normal and breath sounds normal.  Abdominal: Normal appearance. There is no splenomegaly or hepatomegaly.  Musculoskeletal: Normal range of motion.  Neurological: She is alert and oriented to person, place, and time.  Skin: Skin is warm, dry and intact. No rash noted. No pallor.  Psychiatric: She has a normal mood and affect. Her behavior is normal. Judgment and thought content  normal.    No results found for this or any previous visit.    Assessment & Plan:   Problem List Items Addressed This Visit      Unprioritized   COPD, moderate ([place])    Normal Spirometry while on Spireva      Relevant Medications   tiotropium (SPIRIVA) 18 MCG inhalation capsule   albuterol (VENTOLIN HFA) 108 (90 Base) MCG/ACT inhaler   Cyst, dermoid, face - Primary   Relevant Orders   Ambulatory referral to Dermatology   Encounter for evaluation of COPD   Relevant Orders   Spirometry with Graph (Completed)   Hypothyroid    Refer to Endocrine per pt request.  Will check labs today      Relevant Medications   levothyroxine (SYNTHROID, LEVOTHROID) 150 MCG tablet   Other Relevant Orders   Ambulatory referral to Endocrinology   Thyroid Panel With TSH   Vitamin D deficiency    History of this.  Not taking Vit D at this time      Relevant Orders   VITAMIN D 25 Hydroxy (Vit-D Deficiency, Fractures)       Follow up plan: Return if symptoms worsen or fail to improve, for physical.

## 2017-01-05 NOTE — Assessment & Plan Note (Signed)
Normal Spirometry while on Spireva

## 2017-01-05 NOTE — Assessment & Plan Note (Addendum)
Refer to Endocrine per pt request.  Will check labs today

## 2017-01-05 NOTE — Assessment & Plan Note (Signed)
History of this.  Not taking Vit D at this time

## 2017-01-06 ENCOUNTER — Telehealth: Payer: Self-pay | Admitting: Unknown Physician Specialty

## 2017-01-06 LAB — VITAMIN D 25 HYDROXY (VIT D DEFICIENCY, FRACTURES): Vit D, 25-Hydroxy: 27.8 ng/mL — ABNORMAL LOW (ref 30.0–100.0)

## 2017-01-06 LAB — THYROID PANEL WITH TSH
Free Thyroxine Index: 2.8 (ref 1.2–4.9)
T3 Uptake Ratio: 29 % (ref 24–39)
T4, Total: 9.7 ug/dL (ref 4.5–12.0)
TSH: 0.061 u[IU]/mL — ABNORMAL LOW (ref 0.450–4.500)

## 2017-01-06 NOTE — Telephone Encounter (Signed)
Discussed with pt about TSH and Vit D.  TSH suppressed but rest of thyroid panel is normal.  Will hold at this dose and wait for Endocrine opinion.  Vit D a little low.  Will take extra Vitamin D.

## 2017-02-02 ENCOUNTER — Ambulatory Visit (INDEPENDENT_AMBULATORY_CARE_PROVIDER_SITE_OTHER): Payer: 59 | Admitting: Unknown Physician Specialty

## 2017-02-02 ENCOUNTER — Encounter: Payer: Self-pay | Admitting: Unknown Physician Specialty

## 2017-02-02 VITALS — BP 103/67 | HR 87 | Temp 98.7°F | Ht 63.8 in | Wt 149.0 lb

## 2017-02-02 DIAGNOSIS — Z720 Tobacco use: Secondary | ICD-10-CM

## 2017-02-02 DIAGNOSIS — Z0000 Encounter for general adult medical examination without abnormal findings: Secondary | ICD-10-CM | POA: Diagnosis not present

## 2017-02-02 DIAGNOSIS — K140 Glossitis: Secondary | ICD-10-CM

## 2017-02-02 DIAGNOSIS — E039 Hypothyroidism, unspecified: Secondary | ICD-10-CM

## 2017-02-02 DIAGNOSIS — J449 Chronic obstructive pulmonary disease, unspecified: Secondary | ICD-10-CM | POA: Diagnosis not present

## 2017-02-02 MED ORDER — NYSTATIN 100000 UNIT/ML MT SUSP: 5.0000 mL | Freq: Four times a day (QID) | OROMUCOSAL | 0 refills | Status: DC

## 2017-02-02 NOTE — Progress Notes (Signed)
BP 103/67   Pulse 87   Temp 98.7 F (37.1 C)   Ht 5' 3.8" (1.621 m)   Wt 149 lb (67.6 kg)   LMP  (LMP Unknown)   SpO2 98%   BMI 25.74 kg/m    Subjective:    [name], female    DOB: 04/06/1976, 41 y.o.   MRN: [id number]  [name] is a 41 y.o. female  Chief Complaint  Patient presents with  . Annual Exam   Tobacco She would like to quit smoking.  Would like to try patches.  Does not like Chantix.    COPD Recent flare from humidity.  Taking Spireva  Burning mouth History of yeast infections.    Social History   Social History  . Marital status: Married    Spouse name: N/A  . Number of children: N/A  . Years of education: N/A   Occupational History  . Not on file.   Social History Main Topics  . Smoking status: Current Every Day Smoker    Packs/day: 1.00    Types: Cigarettes  . Smokeless tobacco: Never Used  . Alcohol use Yes     Comment: on rare occasion- maybe twice a year   . Drug use: No  . Sexual activity: Yes    Birth control/ protection: IUD   Other Topics Concern  . Not on file   Social History Narrative  . No narrative on file   Family History  Problem Relation Age of Onset  . COPD Mother   . Lymphoma Mother   . Hyperlipidemia Mother   . Thyroid disease Mother   . COPD Father   . Cancer Father        blood  . Thyroid disease Sister   . Emphysema Paternal Grandfather   . Epilepsy Daughter    Past Medical History:  Diagnosis Date  . COPD (chronic obstructive pulmonary disease) ([place])   . Hashimoto's disease   . Thyroid disease    hypothyroid   Social History   Social History  . Marital status: Married    Spouse name: N/A  . Number of children: N/A  . Years of education: N/A   Occupational History  . Not on file.   Social History Main Topics  . Smoking status: Current Every Day Smoker    Packs/day: 1.00    Types: Cigarettes  . Smokeless tobacco: Never Used  . Alcohol use Yes     Comment:  on rare occasion- maybe twice a year   . Drug use: No  . Sexual activity: Yes    Birth control/ protection: IUD   Other Topics Concern  . Not on file   Social History Narrative  . No narrative on file    Relevant past medical, surgical, family and social history reviewed and updated as indicated. Interim medical history since our last visit reviewed. Allergies and medications reviewed and updated.  Review of Systems  Per HPI unless specifically indicated above     Objective:    BP 103/67   Pulse 87   Temp 98.7 F (37.1 C)   Ht 5' 3.8" (1.621 m)   Wt 149 lb (67.6 kg)   LMP  (LMP Unknown)   SpO2 98%   BMI 25.74 kg/m   Wt Readings from Last 3 Encounters:  02/02/17 149 lb (67.6 kg)  01/05/17 147 lb 11.2 oz (67 kg)    Physical Exam  Constitutional: She is oriented to person, place, and  time. She appears well-developed and well-nourished. No distress.  HENT:  Head: Normocephalic and atraumatic.  Mouth/Throat: She has dentures.  Some glossitis  Eyes: Conjunctivae and lids are normal. Right eye exhibits no discharge. Left eye exhibits no discharge. No scleral icterus.  Neck: Normal range of motion. Neck supple. No JVD present. Carotid bruit is not present.  Cardiovascular: Normal rate, regular rhythm and normal heart sounds.   Pulmonary/Chest: Effort normal and breath sounds normal.  Abdominal: Normal appearance. There is no splenomegaly or hepatomegaly.  Musculoskeletal: Normal range of motion.  Neurological: She is alert and oriented to person, place, and time.  Skin: Skin is warm, dry and intact. No rash noted. No pallor.  Psychiatric: She has a normal mood and affect. Her behavior is normal. Judgment and thought content normal.    Results for orders placed or performed in visit on 01/05/17  Thyroid Panel With TSH  Result Value Ref Range   TSH 0.061 (L) 0.450 - 4.500 uIU/mL   T4, Total 9.7 4.5 - 12.0 ug/dL   T3 Uptake Ratio 29 24 - 39 %   Free Thyroxine Index 2.8  1.2 - 4.9  VITAMIN D 25 Hydroxy (Vit-D Deficiency, Fractures)  Result Value Ref Range   Vit D, 25-Hydroxy 27.8 (L) 30.0 - 100.0 ng/mL      Assessment & Plan:   Problem List Items Addressed This Visit      Unprioritized   COPD, moderate ([facility])    Stable, continue present medications.        Hypothyroid    Check levels. Suppressed TSH last visit      Tobacco abuse     I have recommended absolute tobacco cessation. I have discussed various options available for assistance with tobacco cessation including over the counter methods (Nicotine gum, patch and lozenges). We also discussed prescription options (Chantix, Nicotine Inhaler / Nasal Spray). The pt is planning to try nicotine replacement products       Other Visit Diagnoses    Annual physical exam    -  Primary   Relevant Orders   VITAMIN D 25 Hydroxy (Vit-D Deficiency, Fractures)   TSH   Comprehensive metabolic panel   Lipid Panel w/o Chol/HDL Ratio   CBC with Differential/Platelet   Glossitis       Check B12.  rx for Nystatin   Relevant Orders   VITAMIN D 25 Hydroxy (Vit-D Deficiency, Fractures)   B12       Follow up plan: Return if symptoms worsen or fail to improve.

## 2017-02-02 NOTE — Assessment & Plan Note (Signed)
Stable, continue present medications.   

## 2017-02-02 NOTE — Assessment & Plan Note (Addendum)
Check levels. Suppressed TSH last visit

## 2017-02-02 NOTE — Assessment & Plan Note (Addendum)
I have recommended absolute tobacco cessation. I have discussed various options available for assistance with tobacco cessation including over the counter methods (Nicotine gum, patch and lozenges). We also discussed prescription options (Chantix, Nicotine Inhaler / Nasal Spray). The pt is planning to try nicotine replacement products

## 2017-02-03 ENCOUNTER — Encounter: Payer: Self-pay | Admitting: Unknown Physician Specialty

## 2017-02-03 ENCOUNTER — Telehealth: Payer: Self-pay | Admitting: Unknown Physician Specialty

## 2017-02-03 DIAGNOSIS — E039 Hypothyroidism, unspecified: Secondary | ICD-10-CM

## 2017-02-03 LAB — CBC WITH DIFFERENTIAL/PLATELET
BASOS ABS: 0 10*3/uL (ref 0.0–0.2)
Basos: 0 %
EOS (ABSOLUTE): 0.3 10*3/uL (ref 0.0–0.4)
Eos: 2 %
HEMOGLOBIN: 14.3 g/dL (ref 11.1–15.9)
Hematocrit: 42.9 % (ref 34.0–46.6)
IMMATURE GRANS (ABS): 0 10*3/uL (ref 0.0–0.1)
Immature Granulocytes: 0 %
LYMPHS: 17 %
Lymphocytes Absolute: 2.2 10*3/uL (ref 0.7–3.1)
MCH: 30.8 pg (ref 26.6–33.0)
MCHC: 33.3 g/dL (ref 31.5–35.7)
MCV: 93 fL (ref 79–97)
MONOCYTES: 5 %
Monocytes Absolute: 0.6 10*3/uL (ref 0.1–0.9)
Neutrophils Absolute: 9.5 10*3/uL — ABNORMAL HIGH (ref 1.4–7.0)
Neutrophils: 76 %
Platelets: 300 10*3/uL (ref 150–379)
RBC: 4.64 x10E6/uL (ref 3.77–5.28)
RDW: 13.6 % (ref 12.3–15.4)
WBC: 12.6 10*3/uL — AB (ref 3.4–10.8)

## 2017-02-03 LAB — COMPREHENSIVE METABOLIC PANEL
ALBUMIN: 4.5 g/dL (ref 3.5–5.5)
ALT: 9 IU/L (ref 0–32)
AST: 10 IU/L (ref 0–40)
Albumin/Globulin Ratio: 1.8 (ref 1.2–2.2)
Alkaline Phosphatase: 62 IU/L (ref 39–117)
BUN / CREAT RATIO: 9 (ref 9–23)
BUN: 7 mg/dL (ref 6–24)
Bilirubin Total: 0.4 mg/dL (ref 0.0–1.2)
CALCIUM: 9.4 mg/dL (ref 8.7–10.2)
CO2: 22 mmol/L (ref 20–29)
CREATININE: 0.74 mg/dL (ref 0.57–1.00)
Chloride: 104 mmol/L (ref 96–106)
GFR, EST AFRICAN AMERICAN: 115 mL/min/{1.73_m2} (ref >59)
GFR, EST NON AFRICAN AMERICAN: 100 mL/min/{1.73_m2} (ref >59)
GLOBULIN, TOTAL: 2.5 g/dL (ref 1.5–4.5)
Glucose: 97 mg/dL (ref 65–99)
Potassium: 4 mmol/L (ref 3.5–5.2)
SODIUM: 142 mmol/L (ref 134–144)
Total Protein: 7 g/dL (ref 6.0–8.5)

## 2017-02-03 LAB — VITAMIN D 25 HYDROXY (VIT D DEFICIENCY, FRACTURES): VIT D 25 HYDROXY: 24.9 ng/mL — AB (ref 30.0–100.0)

## 2017-02-03 LAB — TSH: TSH: 0.071 u[IU]/mL — AB (ref 0.450–4.500)

## 2017-02-03 LAB — LIPID PANEL W/O CHOL/HDL RATIO
Cholesterol, Total: 167 mg/dL (ref 100–199)
HDL: 44 mg/dL (ref >39)
LDL CALC: 107 mg/dL — AB (ref 0–99)
Triglycerides: 78 mg/dL (ref 0–149)
VLDL Cholesterol Cal: 16 mg/dL (ref 5–40)

## 2017-02-03 LAB — VITAMIN B12: VITAMIN B 12: 272 pg/mL (ref 232–1245)

## 2017-02-03 MED ORDER — LEVOTHYROXINE SODIUM 137 MCG PO TABS: 137.0000 ug | ORAL_TABLET | Freq: Every day | ORAL | 0 refills | Status: DC

## 2017-02-03 NOTE — Telephone Encounter (Signed)
Discussed TSH too low.  Will decrease Synthroid to 137 mcgs.  Recheck in 3 months

## 2017-02-03 NOTE — Progress Notes (Signed)
Patient notified of results by phone.

## 2017-03-23 ENCOUNTER — Encounter: Payer: Self-pay | Admitting: Family Medicine

## 2017-03-23 ENCOUNTER — Ambulatory Visit (INDEPENDENT_AMBULATORY_CARE_PROVIDER_SITE_OTHER): Payer: 59 | Admitting: Family Medicine

## 2017-03-23 VITALS — BP 105/69 | HR 74 | Temp 98.5°F | Wt 149.6 lb

## 2017-03-23 DIAGNOSIS — N760 Acute vaginitis: Secondary | ICD-10-CM | POA: Diagnosis not present

## 2017-03-23 DIAGNOSIS — N898 Other specified noninflammatory disorders of vagina: Secondary | ICD-10-CM | POA: Diagnosis not present

## 2017-03-23 DIAGNOSIS — B9689 Other specified bacterial agents as the cause of diseases classified elsewhere: Secondary | ICD-10-CM

## 2017-03-23 LAB — WET PREP FOR TRICH, YEAST, CLUE
Clue Cell Exam: POSITIVE — AB
TRICHOMONAS EXAM: NEGATIVE
YEAST EXAM: NEGATIVE

## 2017-03-23 MED ORDER — METRONIDAZOLE 500 MG PO TABS: 500.0000 mg | ORAL_TABLET | Freq: Two times a day (BID) | ORAL | 0 refills | Status: DC

## 2017-03-23 NOTE — Progress Notes (Signed)
   BP 105/69 (BP Location: Left Arm, Patient Position: Sitting, Cuff Size: Normal)   Pulse 74   Temp 98.5 F (36.9 C)   Wt 149 lb 9.6 oz (67.9 kg)   SpO2 96%   BMI 25.84 kg/m    Subjective:    [name], female    DOB: [birth year], 41 y.o.   MRN: [id number]  [name] is a 41 y.o. female  Chief Complaint  Patient presents with  . Vaginal Smell  . Vaginal Discharge  . Vaginal Itching  . Dysuria   Patient presents with strong vaginal odor after sex and some vaginal itching/irritation. Denies abnormal discharge, pelvic pain, urinary sxs, fever, lesions. Has had BV in the past with similar sxs. Not trying anything OTC at this time.   Relevant past medical, surgical, family and social history reviewed and updated as indicated. Interim medical history since our last visit reviewed. Allergies and medications reviewed and updated.  Review of Systems  Constitutional: Negative.   HENT: Negative.   Respiratory: Negative.   Cardiovascular: Negative.   Gastrointestinal: Negative.   Genitourinary:       Vaginal odor, itching  Musculoskeletal: Negative.   Neurological: Negative.   Psychiatric/Behavioral: Negative.    Per HPI unless specifically indicated above     Objective:    BP 105/69 (BP Location: Left Arm, Patient Position: Sitting, Cuff Size: Normal)   Pulse 74   Temp 98.5 F (36.9 C)   Wt 149 lb 9.6 oz (67.9 kg)   SpO2 96%   BMI 25.84 kg/m   Wt Readings from Last 3 Encounters:  03/23/17 149 lb 9.6 oz (67.9 kg)  02/02/17 149 lb (67.6 kg)  01/05/17 147 lb 11.2 oz (67 kg)    Physical Exam  Constitutional: She is oriented to person, place, and time. She appears well-developed and well-nourished. No distress.  HENT:  Head: Atraumatic.  Eyes: Pupils are equal, round, and reactive to light. Conjunctivae are normal.  Neck: Normal range of motion.  Cardiovascular: Normal rate.   Pulmonary/Chest: Effort normal and breath sounds normal. No  respiratory distress.  Abdominal: Soft. Bowel sounds are normal. There is no tenderness.  Genitourinary: Vaginal discharge (minimal, thin white) found.  Musculoskeletal: Normal range of motion.  Neurological: She is alert and oriented to person, place, and time.  Skin: Skin is warm and dry.  Psychiatric: She has a normal mood and affect. Her behavior is normal.  Nursing note and vitals reviewed.     Assessment & Plan:   Problem List Items Addressed This Visit    None    Visit Diagnoses    BV (bacterial vaginosis)    -  Primary   Wet prep +, will treat with flagyl and probiotics. Vaginal hygiene reviewed. Avoid sexual contact until after treatment to avoid reinfection. F/u if no better   Relevant Medications   metroNIDAZOLE (FLAGYL) 500 MG tablet   Other Relevant Orders   WET PREP FOR TRICH, YEAST, CLUE   Vaginal itching       Neg for yeast. Discussed no harsh or scented soaps down there, breathable panties, no wet bathing suits for prolonged periods.    Relevant Orders   WET PREP FOR [place], YEAST, CLUE       Follow up plan: Return if symptoms worsen or fail to improve.

## 2017-03-23 NOTE — Patient Instructions (Signed)
Follow up as needed

## 2017-03-24 ENCOUNTER — Telehealth: Payer: Self-pay | Admitting: Family Medicine

## 2017-03-24 ENCOUNTER — Telehealth: Payer: Self-pay | Admitting: Unknown Physician Specialty

## 2017-03-24 MED ORDER — CLINDAMYCIN HCL 300 MG PO CAPS: 300.0000 mg | ORAL_CAPSULE | Freq: Two times a day (BID) | ORAL | 0 refills | Status: DC

## 2017-03-24 NOTE — Telephone Encounter (Signed)
Sent over clindamycin for her to start taking. Take benadryl 1-2 times daily for the hives, if not improving by tomorrow off the medication call back

## 2017-03-24 NOTE — Telephone Encounter (Signed)
Routing to provider  

## 2017-03-24 NOTE — Telephone Encounter (Signed)
Patient having allergic reaction of hives, redness and burning from Flagyl medication. Patient requesting alternative of medication sent to [facility] pharmacy in [place].  Please Advise.  Thank you

## 2017-03-24 NOTE — Telephone Encounter (Signed)
Patient notified medication is at her pharmacy. Patient said that she had already taken Benadryl and it had cleared up some. Explained to patient if not better by tomorrow, to call.

## 2017-03-24 NOTE — Telephone Encounter (Signed)
Entered in error

## 2017-04-30 ENCOUNTER — Other Ambulatory Visit: Payer: Self-pay | Admitting: Unknown Physician Specialty

## 2017-04-30 DIAGNOSIS — E039 Hypothyroidism, unspecified: Secondary | ICD-10-CM

## 2017-07-03 ENCOUNTER — Encounter: Payer: Self-pay | Admitting: Unknown Physician Specialty

## 2017-07-03 ENCOUNTER — Ambulatory Visit: Payer: 59 | Admitting: Unknown Physician Specialty

## 2017-07-03 VITALS — BP 143/94 | HR 88 | Temp 98.6°F | Wt 151.6 lb

## 2017-07-03 DIAGNOSIS — D72828 Other elevated white blood cell count: Secondary | ICD-10-CM

## 2017-07-03 DIAGNOSIS — R110 Nausea: Secondary | ICD-10-CM | POA: Diagnosis not present

## 2017-07-03 DIAGNOSIS — E039 Hypothyroidism, unspecified: Secondary | ICD-10-CM | POA: Diagnosis not present

## 2017-07-03 DIAGNOSIS — K219 Gastro-esophageal reflux disease without esophagitis: Secondary | ICD-10-CM | POA: Diagnosis not present

## 2017-07-03 DIAGNOSIS — R0789 Other chest pain: Secondary | ICD-10-CM | POA: Diagnosis not present

## 2017-07-03 DIAGNOSIS — F325 Major depressive disorder, single episode, in full remission: Secondary | ICD-10-CM | POA: Insufficient documentation

## 2017-07-03 DIAGNOSIS — F322 Major depressive disorder, single episode, severe without psychotic features: Secondary | ICD-10-CM | POA: Diagnosis not present

## 2017-07-03 DIAGNOSIS — R002 Palpitations: Secondary | ICD-10-CM | POA: Diagnosis not present

## 2017-07-03 DIAGNOSIS — F410 Panic disorder [episodic paroxysmal anxiety] without agoraphobia: Secondary | ICD-10-CM | POA: Insufficient documentation

## 2017-07-03 MED ORDER — CITALOPRAM HYDROBROMIDE 20 MG PO TABS: 20.0000 mg | ORAL_TABLET | Freq: Every day | ORAL | 3 refills | Status: DC

## 2017-07-03 MED ORDER — LORAZEPAM 0.5 MG PO TABS: 0.5000 mg | ORAL_TABLET | Freq: Three times a day (TID) | ORAL | 0 refills | Status: DC | PRN

## 2017-07-03 MED ORDER — PANTOPRAZOLE SODIUM 40 MG PO TBEC: 40.0000 mg | DELAYED_RELEASE_TABLET | Freq: Every day | ORAL | 3 refills | Status: DC

## 2017-07-03 NOTE — Progress Notes (Signed)
BP (!) 143/94   Pulse 88   Temp 98.6 F (37 C) (Oral)   Wt 151 lb 9.6 oz (68.8 kg)   SpO2 99%   BMI 26.19 kg/m    Subjective:    [name], female    DOB: 05/22/1976, 42 y.o.   MRN: [id number]  [name] is a 42 y.o. female  Chief Complaint  Patient presents with  . Anxiety    pt states she has been having panic attacks and chest pressure for about 3 weeks  . Gastroesophageal Reflux    pt states she has been having acid reflux    Depression/anxiety Pt states sudden onset of anxiety about 3 weeks ago.  Often wakes up with a panic attack.  States it feels like she loses her breath and might die.  States it hits her suddenly and worse when she is sleepy.  States bilateral arms are heavy Depression screen [facility] 2/9 07/03/2017 01/05/2017  Decreased Interest 3 0  Down, Depressed, Hopeless 3 0  PHQ - 2 Score 6 0  Altered sleeping 3 0  Tired, decreased energy 3 1  Change in appetite 3 1  Feeling bad or failure about yourself  3 1  Trouble concentrating 3 0  Moving slowly or fidgety/restless 2 0  Suicidal thoughts 0 0  PHQ-9 Score 23 3   GERD This is bothering her quite a bit.  Takes Gavison OTC which helps but doesn't control it.  Has nausea but no vomiting.    Relevant past medical, surgical, family and social history reviewed and updated as indicated. Interim medical history since our last visit reviewed. Allergies and medications reviewed and updated.  Review of Systems  Constitutional: Positive for fatigue.  HENT: Negative.   Respiratory:       Not SOB until she gets her episodes  Gastrointestinal: Positive for nausea. Negative for diarrhea.       Poor appetite    Per HPI unless specifically indicated above     Objective:    BP (!) 143/94   Pulse 88   Temp 98.6 F (37 C) (Oral)   Wt 151 lb 9.6 oz (68.8 kg)   SpO2 99%   BMI 26.19 kg/m   Wt Readings from Last 3 Encounters:  07/03/17 151 lb 9.6 oz (68.8 kg)  03/23/17 149 lb 9.6  oz (67.9 kg)  02/02/17 149 lb (67.6 kg)    Physical Exam  Constitutional: She is oriented to person, place, and time. She appears well-developed and well-nourished. No distress.  HENT:  Head: Normocephalic and atraumatic.  Eyes: Conjunctivae and lids are normal. Right eye exhibits no discharge. Left eye exhibits no discharge. No scleral icterus.  Neck: Normal range of motion. Neck supple. No JVD present. Carotid bruit is not present.  Cardiovascular: Normal rate, regular rhythm and normal heart sounds.  Pulmonary/Chest: Effort normal and breath sounds normal.  Abdominal: Normal appearance. There is no splenomegaly or hepatomegaly.  Musculoskeletal: Normal range of motion.  Neurological: She is alert and oriented to person, place, and time.  Skin: Skin is warm, dry and intact. No rash noted. No pallor.  Psychiatric: She has a normal mood and affect. Her behavior is normal. Judgment and thought content normal.     CBC is normal EKG NSR without STTW changes.    Assessment & Plan:   Problem List Items Addressed This Visit      Unprioritized   Depression, major, single episode, severe ([place])  Start Citalopram 20 mg daily.        Relevant Medications   citalopram (CELEXA) 20 MG tablet   LORazepam (ATIVAN) 0.5 MG tablet   Hypothyroid    Check TSH today      Panic    Discussed Citalopram as primary treatment.  Limited course of Lorazepam prn until Citalopram takes effect      Relevant Medications   citalopram (CELEXA) 20 MG tablet   LORazepam (ATIVAN) 0.5 MG tablet    Other Visit Diagnoses    Chest pressure    -  Primary   EKG is normal.  Refer to cardiology.  Pt ed on going to the ER further episodes   Relevant Orders   EKG 12-Lead (Completed)   TSH   Ambulatory referral to Cardiology   Palpitations       EKG nl.  Risk factor of smoking.  Will refer to cardiology   Relevant Orders   Comprehensive metabolic panel   TSH   Other elevated white blood cell (WBC) count        WBC 11,000 today compared to previous 12000.  Will monitor periodically   Relevant Orders   CBC With Differential/Platelet   Comprehensive metabolic panel   Nausea       Relevant Orders   Amylase   Lipase   Gastroesophageal reflux disease without esophagitis       Long standing.  Maybe contibuting.  Will treat with Omeprazole 20 mg daily   Relevant Medications   pantoprazole (PROTONIX) 40 MG tablet       Follow up plan: Return in about 2 weeks (around 07/17/2017).

## 2017-07-03 NOTE — Assessment & Plan Note (Signed)
Discussed Citalopram as primary treatment.  Limited course of Lorazepam prn until Citalopram takes effect

## 2017-07-03 NOTE — Assessment & Plan Note (Signed)
Check TSH today

## 2017-07-03 NOTE — Assessment & Plan Note (Signed)
Start Citalopram 20 mg daily.

## 2017-07-04 LAB — COMPREHENSIVE METABOLIC PANEL
ALK PHOS: 57 IU/L (ref 39–117)
ALT: 7 IU/L (ref 0–32)
AST: 11 IU/L (ref 0–40)
Albumin/Globulin Ratio: 1.7 (ref 1.2–2.2)
Albumin: 4.5 g/dL (ref 3.5–5.5)
BUN/Creatinine Ratio: 8 — ABNORMAL LOW (ref 9–23)
BUN: 8 mg/dL (ref 6–24)
Bilirubin Total: 0.5 mg/dL (ref 0.0–1.2)
CALCIUM: 9.9 mg/dL (ref 8.7–10.2)
CO2: 21 mmol/L (ref 20–29)
CREATININE: 0.96 mg/dL (ref 0.57–1.00)
Chloride: 105 mmol/L (ref 96–106)
GFR calc Af Amer: 84 mL/min/{1.73_m2} (ref >59)
GFR, EST NON AFRICAN AMERICAN: 73 mL/min/{1.73_m2} (ref >59)
Globulin, Total: 2.7 g/dL (ref 1.5–4.5)
Glucose: 103 mg/dL — ABNORMAL HIGH (ref 65–99)
Potassium: 4.9 mmol/L (ref 3.5–5.2)
Sodium: 142 mmol/L (ref 134–144)
Total Protein: 7.2 g/dL (ref 6.0–8.5)

## 2017-07-04 LAB — TSH: TSH: 3.64 u[IU]/mL (ref 0.450–4.500)

## 2017-07-04 LAB — AMYLASE: AMYLASE: 41 U/L (ref 31–124)

## 2017-07-04 LAB — LIPASE: LIPASE: 14 U/L (ref 14–72)

## 2017-07-06 LAB — SPECIMEN STATUS REPORT

## 2017-07-17 ENCOUNTER — Ambulatory Visit: Payer: 59 | Admitting: Unknown Physician Specialty

## 2017-07-21 LAB — CBC WITH DIFFERENTIAL/PLATELET
Hematocrit: 42.3 % (ref 34.0–46.6)
Hemoglobin: 14.9 g/dL (ref 11.1–15.9)
LYMPHS ABS: 2.7 10*3/uL (ref 0.7–3.1)
LYMPHS: 24 %
MCH: 33 pg (ref 26.6–33.0)
MCHC: 35.2 g/dL (ref 31.5–35.7)
MCV: 94 fL (ref 79–97)
MID (ABSOLUTE): 0.5 10*3/uL (ref 0.1–1.6)
MID: 4 %
NEUTROS ABS: 8 10*3/uL — AB (ref 1.4–7.0)
NEUTROS PCT: 72 %
PLATELETS: 286 10*3/uL (ref 150–379)
RBC: 4.52 x10E6/uL (ref 3.77–5.28)
RDW: 13.5 % (ref 12.3–15.4)
WBC: 11.2 10*3/uL — ABNORMAL HIGH (ref 3.4–10.8)

## 2017-07-24 ENCOUNTER — Encounter: Payer: Self-pay | Admitting: Unknown Physician Specialty

## 2017-07-24 ENCOUNTER — Ambulatory Visit: Payer: 59 | Admitting: Unknown Physician Specialty

## 2017-07-24 DIAGNOSIS — F410 Panic disorder [episodic paroxysmal anxiety] without agoraphobia: Secondary | ICD-10-CM

## 2017-07-24 DIAGNOSIS — F322 Major depressive disorder, single episode, severe without psychotic features: Secondary | ICD-10-CM

## 2017-07-24 NOTE — Progress Notes (Signed)
BP 117/79   Pulse 88   Temp 98.6 F (37 C) (Oral)   Wt 149 lb 3.2 oz (67.7 kg)   SpO2 98%   BMI 25.77 kg/m    Subjective:    [name], female    DOB: Jul 31, 1975, 42 y.o.   MRN: [id number]  [name] is a 42 y.o. female  Chief Complaint  Patient presents with  . Depression    2 week f/up   Depression/anxiety Pt states the Citalopram has helped her panic attacks.  States they are better with 75% improvement.  States she is not taking Lorazepam anymore as it makes her too sleepy Depression screen [facility] 2/9 07/24/2017 07/03/2017 01/05/2017  Decreased Interest 1 3 0  Down, Depressed, Hopeless 0 3 0  PHQ - 2 Score 1 6 0  Altered sleeping 1 3 0  Tired, decreased energy 1 3 1   Change in appetite 2 3 1   Feeling bad or failure about yourself  2 3 1   Trouble concentrating 0 3 0  Moving slowly or fidgety/restless 0 2 0  Suicidal thoughts 0 0 0  PHQ-9 Score 7 23 3       Relevant past medical, surgical, family and social history reviewed and updated as indicated. Interim medical history since our last visit reviewed. Allergies and medications reviewed and updated.  Review of Systems  Per HPI unless specifically indicated above     Objective:    BP 117/79   Pulse 88   Temp 98.6 F (37 C) (Oral)   Wt 149 lb 3.2 oz (67.7 kg)   SpO2 98%   BMI 25.77 kg/m   Wt Readings from Last 3 Encounters:  07/24/17 149 lb 3.2 oz (67.7 kg)  07/03/17 151 lb 9.6 oz (68.8 kg)  03/23/17 149 lb 9.6 oz (67.9 kg)    Physical Exam  Constitutional: She is oriented to person, place, and time. She appears well-developed and well-nourished. No distress.  HENT:  Head: Normocephalic and atraumatic.  Eyes: Conjunctivae and lids are normal. Right eye exhibits no discharge. Left eye exhibits no discharge. No scleral icterus.  Cardiovascular: Normal rate.  Pulmonary/Chest: Effort normal.  Abdominal: Normal appearance. There is no splenomegaly or hepatomegaly.    Musculoskeletal: Normal range of motion.  Neurological: She is alert and oriented to person, place, and time.  Skin: Skin is intact. No rash noted. No pallor.  Psychiatric: She has a normal mood and affect. Her behavior is normal. Judgment and thought content normal.    Results for orders placed or performed in visit on 07/03/17  CBC With Differential/Platelet  Result Value Ref Range   WBC 11.2 (H) 3.4 - 10.8 x10E3/uL   RBC 4.52 3.77 - 5.28 x10E6/uL   Hemoglobin 14.9 11.1 - 15.9 g/dL   Hematocrit 42.3 34.0 - 46.6 %   MCV 94 79 - 97 fL   MCH 33.0 26.6 - 33.0 pg   MCHC 35.2 31.5 - 35.7 g/dL   RDW 13.5 12.3 - 15.4 %   Platelets 286 150 - 379 x10E3/uL   Neutrophils 72 Not Estab. %   Lymphs 24 Not Estab. %   MID 4 Not Estab. %   Neutrophils Absolute 8.0 (H) 1.4 - 7.0 x10E3/uL   Lymphocytes Absolute 2.7 0.7 - 3.1 x10E3/uL   MID (Absolute) 0.5 0.1 - 1.6 X10E3/uL  Comprehensive metabolic panel  Result Value Ref Range   Glucose 103 (H) 65 - 99 mg/dL   BUN 8 6 - 24 mg/dL  Creatinine, Ser 0.96 0.57 - 1.00 mg/dL   GFR calc non Af Amer 73 >59 mL/min/1.73   GFR calc Af Amer 84 >59 mL/min/1.73   BUN/Creatinine Ratio 8 (L) 9 - 23   Sodium 142 134 - 144 mmol/L   Potassium 4.9 3.5 - 5.2 mmol/L   Chloride 105 96 - 106 mmol/L   CO2 21 20 - 29 mmol/L   Calcium 9.9 8.7 - 10.2 mg/dL   Total Protein 7.2 6.0 - 8.5 g/dL   Albumin 4.5 3.5 - 5.5 g/dL   Globulin, Total 2.7 1.5 - 4.5 g/dL   Albumin/Globulin Ratio 1.7 1.2 - 2.2   Bilirubin Total 0.5 0.0 - 1.2 mg/dL   Alkaline Phosphatase 57 39 - 117 IU/L   AST 11 0 - 40 IU/L   ALT 7 0 - 32 IU/L  TSH  Result Value Ref Range   TSH 3.640 0.450 - 4.500 uIU/mL  Amylase  Result Value Ref Range   Amylase 41 31 - 124 U/L  Lipase  Result Value Ref Range   Lipase 14 14 - 72 U/L  Specimen status report  Result Value Ref Range   specimen status report Comment       Assessment & Plan:   Problem List Items Addressed This Visit      Unprioritized    Depression, major, single episode, severe ([facility])    Much improved on Citalopram.  PHQ-9 score decreased from 23-7.        Panic    Much improvement on Citalopram 20 mg.  Pt ed that this should continue to improve over the next several weeks.           Continue Citalopram at present dose.  Recheck in 1 months  Follow up plan: Return in about 4 weeks (around 08/21/2017).

## 2017-07-24 NOTE — Assessment & Plan Note (Signed)
Much improvement on Citalopram 20 mg.  Pt ed that this should continue to improve over the next several weeks.

## 2017-07-24 NOTE — Assessment & Plan Note (Signed)
Much improved on Citalopram.  PHQ-9 score decreased from 23-7.

## 2017-08-05 ENCOUNTER — Other Ambulatory Visit: Payer: Self-pay | Admitting: Unknown Physician Specialty

## 2017-08-05 DIAGNOSIS — E039 Hypothyroidism, unspecified: Secondary | ICD-10-CM

## 2017-08-13 ENCOUNTER — Encounter: Payer: Self-pay | Admitting: Cardiovascular Disease

## 2017-08-13 ENCOUNTER — Ambulatory Visit: Payer: 59 | Admitting: Cardiovascular Disease

## 2017-08-13 VITALS — BP 118/70 | HR 83 | Ht 64.0 in | Wt 149.8 lb

## 2017-08-13 DIAGNOSIS — R0602 Shortness of breath: Secondary | ICD-10-CM | POA: Diagnosis not present

## 2017-08-13 DIAGNOSIS — Z720 Tobacco use: Secondary | ICD-10-CM

## 2017-08-13 DIAGNOSIS — R079 Chest pain, unspecified: Secondary | ICD-10-CM | POA: Diagnosis not present

## 2017-08-13 NOTE — Patient Instructions (Addendum)
Medication Instructions:  Your physician recommends that you continue on your current medications as directed. Please refer to the Current Medication list given to you today.   Labwork: none  Testing/Procedures: Your physician has requested that you have an echocardiogram. Echocardiography is a painless test that uses sound waves to create images of your heart. It provides your doctor with information about the size and shape of your heart and how well your heart's chambers and valves are working. This procedure takes approximately one hour. There are no restrictions for this procedure.  Your physician has requested that you have an exercise tolerance test. For further information please visit [email or web address]. Please also follow instruction sheet, as given.  Avoid caffeine and smoking 24 hours before your test. Wear comfortable walking clothes and shoes (I.e., sneakers) Bring your inhaler to the test.    Follow-Up: Your physician recommends that you schedule a follow-up appointment as needed.    Any Other Special Instructions Will Be Listed Below (If Applicable).     If you need a refill on your cardiac medications before your next appointment, please call your pharmacy.  Echocardiogram An echocardiogram, or echocardiography, uses sound waves (ultrasound) to produce an image of your heart. The echocardiogram is simple, painless, obtained within a short period of time, and offers valuable information to your health care provider. The images from an echocardiogram can provide information such as:  Evidence of coronary artery disease (CAD).  Heart size.  Heart muscle function.  Heart valve function.  Aneurysm detection.  Evidence of a past heart attack.  Fluid buildup around the heart.  Heart muscle thickening.  Assess heart valve function.  Tell a health care provider about:  Any allergies you have.  All medicines you are taking, including vitamins, herbs, eye  drops, creams, and over-the-counter medicines.  Any problems you or family members have had with anesthetic medicines.  Any blood disorders you have.  Any surgeries you have had.  Any medical conditions you have.  Whether you are pregnant or may be pregnant. What happens before the procedure? No special preparation is needed. Eat and drink normally. What happens during the procedure?  In order to produce an image of your heart, gel will be applied to your chest and a wand-like tool (transducer) will be moved over your chest. The gel will help transmit the sound waves from the transducer. The sound waves will harmlessly bounce off your heart to allow the heart images to be captured in real-time motion. These images will then be recorded.  You may need an IV to receive a medicine that improves the quality of the pictures. What happens after the procedure? You may return to your normal schedule including diet, activities, and medicines, unless your health care provider tells you otherwise. This information is not intended to replace advice given to you by your health care provider. Make sure you discuss any questions you have with your health care provider. Document Released: 05/30/2000 Document Revised: 01/19/2016 Document Reviewed: 02/07/2013 Elsevier Interactive Patient Education  2017 [employer].  Exercise Stress Electrocardiogram An exercise stress electrocardiogram is a test to check how blood flows to your heart. It is done to find areas of poor blood flow. You will need to walk on a treadmill for this test. The electrocardiogram will record your heartbeat when you are at rest and when you are exercising. What happens before the procedure?  Do not have drinks with caffeine or foods with caffeine for 24 hours before the test, or  as told by your doctor. This includes coffee, tea (even decaf tea), sodas, chocolate, and cocoa.  Follow your doctor's instructions about eating and  drinking before the test.  Ask your doctor what medicines you should or should not take before the test. Take your medicines with water unless told by your doctor not to.  If you use an inhaler, bring it with you to the test.  Bring a snack to eat after the test.  Do not  smoke for 4 hours before the test.  Do not put lotions, powders, creams, or oils on your chest before the test.  Wear comfortable shoes and clothing. What happens during the procedure?  You will have patches put on your chest. Small areas of your chest may need to be shaved. Wires will be connected to the patches.  Your heart rate will be watched while you are resting and while you are exercising.  You will walk on the treadmill. The treadmill will slowly get faster to raise your heart rate.  The test will take about 1-2 hours. What happens after the procedure?  Your heart rate and blood pressure will be watched after the test.  You may return to your normal diet, activities, and medicines or as told by your doctor. This information is not intended to replace advice given to you by your health care provider. Make sure you discuss any questions you have with your health care provider. Document Released: 11/19/2007 Document Revised: 01/30/2016 Document Reviewed: 02/07/2013 Elsevier Interactive Patient Education  2018 [place] with Quitting Smoking Quitting smoking is a physical and mental challenge. You will face cravings, withdrawal symptoms, and temptation. Before quitting, work with your health care provider to make a plan that can help you cope. Preparation can help you quit and keep you from giving in. How can I cope with cravings? Cravings usually last for 5-10 minutes. If you get through it, the craving will pass. Consider taking the following actions to help you cope with cravings:  Keep your mouth busy: ? Chew sugar-free gum. ? Suck on hard candies or a straw. ? Brush your teeth.  Keep  your hands and body busy: ? Immediately change to a different activity when you feel a craving. ? Squeeze or play with a ball. ? Do an activity or a hobby, like making bead jewelry, practicing needlepoint, or working with wood. ? Mix up your normal routine. ? Take a short exercise break. Go for a quick walk or run up and down stairs. ? Spend time in public places where smoking is not allowed.  Focus on doing something kind or helpful for someone else.  Call a friend or family member to talk during a craving.  Join a support group.  Call a quit line, such as 1-800-QUIT-NOW.  Talk with your health care provider about medicines that might help you cope with cravings and make quitting easier for you.  How can I deal with withdrawal symptoms? Your body may experience negative effects as it tries to get used to not having nicotine in the system. These effects are called withdrawal symptoms. They may include:  Feeling hungrier than normal.  Trouble concentrating.  Irritability.  Trouble sleeping.  Feeling depressed.  Restlessness and agitation.  Craving a cigarette.  To manage withdrawal symptoms:  Avoid places, people, and activities that trigger your cravings.  Remember why you want to quit.  Get plenty of sleep.  Avoid coffee and other caffeinated drinks. These may worsen some  of your symptoms.  How can I handle social situations? Social situations can be difficult when you are quitting smoking, especially in the first few weeks. To manage this, you can:  Avoid parties, bars, and other social situations where people might be smoking.  Avoid alcohol.  Leave right away if you have the urge to smoke.  Explain to your family and friends that you are quitting smoking. Ask for understanding and support.  Plan activities with friends or family where smoking is not an option.  What are some ways I can cope with stress? Wanting to smoke may cause stress, and stress can  make you want to smoke. Find ways to manage your stress. Relaxation techniques can help. For example:  Breathe slowly and deeply, in through your nose and out through your mouth.  Listen to soothing, relaxing music.  Talk with a family member or friend about your stress.  Light a candle.  Soak in a bath or take a shower.  Think about a peaceful place.  What are some ways I can prevent weight gain? Be aware that many people gain weight after they quit smoking. However, not everyone does. To keep from gaining weight, have a plan in place before you quit and stick to the plan after you quit. Your plan should include:  Having healthy snacks. When you have a craving, it may help to: ? Eat plain popcorn, crunchy carrots, celery, or other cut vegetables. ? Chew sugar-free gum.  Changing how you eat: ? Eat small portion sizes at meals. ? Eat 4-6 small meals throughout the day instead of 1-2 large meals a day. ? Be mindful when you eat. Do not watch television or do other things that might distract you as you eat.  Exercising regularly: ? Make time to exercise each day. If you do not have time for a long workout, do short bouts of exercise for 5-10 minutes several times a day. ? Do some form of strengthening exercise, like weight lifting, and some form of aerobic exercise, like running or swimming.  Drinking plenty of water or other low-calorie or no-calorie drinks. Drink 6-8 glasses of water daily, or as much as instructed by your health care provider.  Summary  Quitting smoking is a physical and mental challenge. You will face cravings, withdrawal symptoms, and temptation to smoke again. Preparation can help you as you go through these challenges.  You can cope with cravings by keeping your mouth busy (such as by chewing gum), keeping your body and hands busy, and making calls to family, friends, or a helpline for people who want to quit smoking.  You can cope with withdrawal symptoms  by avoiding places where people smoke, avoiding drinks with caffeine, and getting plenty of rest.  Ask your health care provider about the different ways to prevent weight gain, avoid stress, and handle social situations. This information is not intended to replace advice given to you by your health care provider. Make sure you discuss any questions you have with your health care provider. Document Released: 05/30/2016 Document Revised: 05/30/2016 Document Reviewed: 05/30/2016 Elsevier Interactive Patient Education  2018 [employer].  Smoking Tobacco Information Smoking tobacco will very likely harm your health. Tobacco contains a poisonous (toxic), colorless chemical called nicotine. Nicotine affects the brain and makes tobacco addictive. This change in your brain can make it hard to stop smoking. Tobacco also has other toxic chemicals that can hurt your body and raise your risk of many cancers. How can  smoking tobacco affect me? Smoking tobacco can increase your chances of having serious health conditions, such as:  Cancer. Smoking is most commonly associated with lung cancer, but can lead to cancer in other parts of the body.  Chronic obstructive pulmonary disease (COPD). This is a long-term lung condition that makes it hard to breathe. It also gets worse over time.  High blood pressure (hypertension), heart disease, stroke, or heart attack.  Lung infections, such as pneumonia.  Cataracts. This is when the lenses in the eyes become clouded.  Digestive problems. This may include peptic ulcers, heartburn, and gastroesophageal reflux disease (GERD).  Oral health problems, such as gum disease and tooth loss.  Loss of taste and smell.  Smoking can affect your appearance by causing:  Wrinkles.  Yellow or stained teeth, fingers, and fingernails.  Smoking tobacco can also affect your social life.  Many workplaces, [employer], hotels, and public places are tobacco-free. This means  that you may experience challenges in finding places to smoke when away from home.  The cost of a smoking habit can be expensive. Expenses for someone who smokes come in two ways: ? You spend money on a regular basis to buy tobacco. ? Your health care costs in the long-term are higher if you smoke.  Tobacco smoke can also affect the health of those around you. Children of smokers have greater chances of: ? Sudden infant death syndrome (SIDS). ? Ear infections. ? Lung infections.  What lifestyle changes can be made?  Do not start smoking. Quit if you already do.  To quit smoking: ? Make a plan to quit smoking and commit yourself to it. Look for programs to help you and ask your health care provider for recommendations and ideas. ? Talk with your health care provider about using nicotine replacement medicines to help you quit. Medicine replacement medicines include gum, lozenges, patches, sprays, or pills. ? Do not replace cigarette smoking with electronic cigarettes, which are commonly called e-cigarettes. The safety of e-cigarettes is not known, and some may contain harmful chemicals. ? Avoid places, people, or situations that tempt you to smoke. ? If you try to quit but return to smoking, don't give up hope. It is very common for people to try a number of times before they fully succeed. When you feel ready again, give it another try.  Quitting smoking might affect the way you eat as well as your weight. Be prepared to monitor your eating habits. Get support in planning and following a healthy diet.  Ask your health care provider about having regular tests (screenings) to check for cancer. This may include blood tests, imaging tests, and other tests.  Exercise regularly. Consider taking walks, joining a gym, or doing yoga or exercise classes.  Develop skills to manage your stress. These skills include meditation. What are the benefits of quitting smoking? By quitting smoking, you  may:  Lower your risk of getting cancer and other diseases caused by smoking.  Live longer.  Breathe better.  Lower your blood pressure and heart rate.  Stop your addiction to tobacco.  Stop creating secondhand smoke that hurts other people.  Improve your sense of taste and smell.  Look better over time, due to having fewer wrinkles and less staining.  What can happen if changes are not made? If you do not stop smoking, you may:  Get cancer and other diseases.  Develop COPD or other long-term (chronic) lung conditions.  Develop serious problems with your heart and blood  vessels (cardiovascular system).  Need more tests to screen for problems caused by smoking.  Have higher, long-term healthcare costs from medicines or treatments related to smoking.  Continue to have worsening changes in your lungs, mouth, and nose.  Where to find support: To get support to quit smoking, consider:  Asking your health care provider for more information and resources.  Taking classes to learn more about quitting smoking.  Looking for local organizations that offer resources about quitting smoking.  Joining a support group for people who want to quit smoking in your local community.  Where to find more information: You may find more information about quitting smoking from:  HelpGuide.org: www.helpguide.org/articles/addictions/how-to-quit-smoking.htm  https://hall.com/: smokefree.gov  American Lung Association: www.lung.org  Contact a health care provider if:  You have problems breathing.  Your lips, nose, or fingers turn blue.  You have chest pain.  You are coughing up blood.  You feel faint or you pass out.  You have other noticeable changes that cause you to worry. Summary  Smoking tobacco can negatively affect your health, the health of those around you, your finances, and your social life.  Do not start smoking. Quit if you already do. If you need help quitting, ask  your health care provider.  Think about joining a support group for people who want to quit smoking in your local community. There are many effective programs that will help you to quit this behavior. This information is not intended to replace advice given to you by your health care provider. Make sure you discuss any questions you have with your health care provider. Document Released: 06/17/2016 Document Revised: 06/17/2016 Document Reviewed: 06/17/2016 Elsevier Interactive Patient Education  [employer].

## 2017-08-13 NOTE — Progress Notes (Signed)
Cardiology Office Note   Date:  08/13/2017   ID:  Meagan Savage, DOB 06-Dec-1975, MRN [id number]  PCP:  Kathrine Haddock, NP  Cardiologist:   Kathlyn Sacramento, MD   Chief Complaint  Patient presents with  . New Patient (Initial Visit)    Per Kathrine Haddock Chest Pressure and palpitations. Meds reviewed verbally with patient.       History of Present Illness: Meagan Savage is a 42 y.o. female who was referred by Kathrine Haddock for evaluation of chest pain.  She has no prior cardiac history.  She has known history of depression and anxiety which is currently being treated. She has no history of diabetes, hypertension or hyperlipidemia.  She does have prolonged history of tobacco use and has been smoking 1 pack/day for 25 years. Over the last 2 months, she has experienced intermittent episode of chest pain.  The chest pain is substernal with radiation to the left side and the left shoulder.  It is described as heaviness and tightness feeling that lasts for about 2-3 minutes.  This happens at rest and occasionally wakes her up from sleep.  It is triggered by sleep deprivation and anxiety.  These episodes are not exertional. She also reports chronic exertional dyspnea which she attributed to her chronic tobacco use. She does not feel that she gets enough sleep as she works 12-1/2 hours daily and she has to drive 3 hours for work.    Past Medical History:  Diagnosis Date  . COPD (chronic obstructive pulmonary disease) ([place])   . Hashimoto's disease   . Thyroid disease    hypothyroid    Past Surgical History:  Procedure Laterality Date  . CHOLECYSTECTOMY     1995  . OOPHORECTOMY Right 2017     Current Outpatient Medications  Medication Sig Dispense Refill  . albuterol (VENTOLIN HFA) 108 (90 Base) MCG/ACT inhaler Inhale 2 puffs into the lungs every 6 (six) hours as needed for wheezing or shortness of breath.    . Cholecalciferol (VITAMIN D) 2000 units CAPS Take 2,000 Units by  mouth daily.    . citalopram (CELEXA) 20 MG tablet Take 1 tablet (20 mg total) by mouth daily. 30 tablet 3  . levothyroxine (SYNTHROID, LEVOTHROID) 137 MCG tablet TAKE 1 TABLET(137 MCG) BY MOUTH DAILY BEFORE BREAKFAST 90 tablet 0  . pantoprazole (PROTONIX) 40 MG tablet Take 1 tablet (40 mg total) by mouth daily. 30 tablet 3  . tiotropium (SPIRIVA) 18 MCG inhalation capsule Place 18 mcg into inhaler and inhale daily.     No current facility-administered medications for this visit.     Allergies:   Flagyl [metronidazole] and Penicillins    Social History:  The patient  reports that she has been smoking cigarettes.  She has been smoking about 1.00 pack per day. she has never used smokeless tobacco. She reports that she drinks alcohol. She reports that she does not use drugs.   Family History:  The patient's family history includes COPD in her father and mother; Cancer in her father; Emphysema in her paternal grandfather; Epilepsy in her daughter; Hyperlipidemia in her mother; Lymphoma in her mother; Thyroid disease in her mother and sister.    ROS:  Please see the history of present illness.   Otherwise, review of systems are positive for none.   All other systems are reviewed and negative.    PHYSICAL EXAM: VS:  BP 118/70 (BP Location: Left Arm, Patient Position: Sitting, Cuff Size: Normal)   Pulse 83  Ht 5\' 4"  (1.626 m)   Wt 149 lb 12 oz (67.9 kg)   BMI 25.70 kg/m  , BMI Body mass index is 25.7 kg/m. GEN: Well nourished, well developed, in no acute distress  HEENT: normal  Neck: no JVD, carotid bruits, or masses Cardiac: RRR; no murmurs, rubs, or gallops,no edema  Respiratory:  clear to auscultation bilaterally, normal work of breathing GI: soft, nontender, nondistended, + BS MS: no deformity or atrophy  Skin: warm and dry, no rash Neuro:  Strength and sensation are intact Psych: euthymic mood, full affect   EKG:  EKG is ordered today. The ekg ordered today demonstrates     Recent Labs: 07/03/2017: ALT 7; BUN 8; Creatinine, Ser 0.96; Hemoglobin 14.9; Platelets 286; Potassium 4.9; Sodium 142; TSH 3.640    Lipid Panel    Component Value Date/Time   CHOL 167 02/02/2017 0948   TRIG 78 02/02/2017 0948   HDL 44 02/02/2017 0948   LDLCALC 107 (H) 02/02/2017 0948      Wt Readings from Last 3 Encounters:  08/13/17 149 lb 12 oz (67.9 kg)  07/24/17 149 lb 3.2 oz (67.7 kg)  07/03/17 151 lb 9.6 oz (68.8 kg)       No flowsheet data found.    ASSESSMENT AND PLAN:  1.  Atypical chest pain: I suspect likely due to stress and anxiety.  However, she has prolonged history of tobacco use and thus I recommend evaluation with a treadmill stress test. I discussed with her the importance of healthy lifestyle changes.  2.  Shortness of breath likely due to prolonged tobacco use no previous cardiac evaluation.  I requested an echocardiogram.  3.  Tobacco use: I discussed with her the importance of smoking cessation.    Disposition:   FU with me as needed.  Signed,  Kathlyn Sacramento, MD  08/13/2017 9:39 AM    [facility]

## 2017-08-18 NOTE — Addendum Note (Signed)
Addended by: Janan Ridge on: 08/18/2017 07:08 AM   Modules accepted: Orders

## 2017-08-21 ENCOUNTER — Ambulatory Visit
Admission: RE | Admit: 2017-08-21 | Discharge: 2017-08-21 | Disposition: A | Discharge status: Home / Self Care | Payer: 59 | Source: Ambulatory Visit | Attending: Family Medicine | Admitting: Family Medicine

## 2017-08-21 ENCOUNTER — Encounter: Payer: Self-pay | Admitting: Family Medicine

## 2017-08-21 ENCOUNTER — Ambulatory Visit: Payer: 59 | Admitting: Family Medicine

## 2017-08-21 ENCOUNTER — Ambulatory Visit: Payer: 59 | Admitting: Unknown Physician Specialty

## 2017-08-21 VITALS — BP 102/68 | HR 78 | Temp 98.2°F | Wt 151.3 lb

## 2017-08-21 DIAGNOSIS — M25512 Pain in left shoulder: Principal | ICD-10-CM

## 2017-08-21 DIAGNOSIS — F322 Major depressive disorder, single episode, severe without psychotic features: Secondary | ICD-10-CM

## 2017-08-21 DIAGNOSIS — G8929 Other chronic pain: Secondary | ICD-10-CM

## 2017-08-21 MED ORDER — CITALOPRAM HYDROBROMIDE 20 MG PO TABS: 20.0000 mg | ORAL_TABLET | Freq: Every day | ORAL | 0 refills | Status: DC

## 2017-08-21 MED ORDER — VORTIOXETINE HBR 10 MG PO TABS: 10.0000 mg | ORAL_TABLET | Freq: Every day | ORAL | 1 refills | Status: DC

## 2017-08-21 NOTE — Progress Notes (Signed)
BP 102/68 (BP Location: Right Arm, Patient Position: Sitting, Cuff Size: Normal)   Pulse 78   Temp 98.2 F (36.8 C) (Tympanic)   Wt 151 lb 4.8 oz (68.6 kg)   SpO2 98%   BMI 25.97 kg/m    Subjective:    [name], female    DOB: 02-Jul-1975, 42 y.o.   MRN: [id number]  [name] is a 42 y.o. female  Chief Complaint  Patient presents with  . Shoulder Pain    Left; x's 3 weeks. Throbbing pain, can't pick anything up. If patient tries, she'll drop it. Has hx of dislocating her left shoulder.   Pt here today with left shoulder pain and weakness progressively worsening the past month. Shoulder dislocation about 6 years ago from reaching for something wrong, was able to pop it back in without care at that time. The pain started coming back about 7-8 months ago and has gotten worse the past month. Can't hold any sort of weight with that arm, difficulty sleeping without severe pain, newly occurring ROM limitations. Very concerned she's torn something and wanting orthopedic referral. Taking OTC pain relievers with minimal relief.   Notes significant sexual side effects with celexa, has failed lexapro and wellbutrin in the past as well. States it controls her moods fairly well but the side effects are intolerable. Denies SI/HI.   Past Medical History:  Diagnosis Date  . COPD (chronic obstructive pulmonary disease) ([place])   . Hashimoto's disease   . Thyroid disease    hypothyroid   Social History   Socioeconomic History  . Marital status: Married    Spouse name: Not on file  . Number of children: Not on file  . Years of education: Not on file  . Highest education level: Not on file  Social Needs  . Financial resource strain: Not on file  . Food insecurity - worry: Not on file  . Food insecurity - inability: Not on file  . Transportation needs - medical: Not on file  . Transportation needs - non-medical: Not on file  Occupational History  . Not on file    Tobacco Use  . Smoking status: Current Every Day Smoker    Packs/day: 1.00    Types: Cigarettes  . Smokeless tobacco: Never Used  Substance and Sexual Activity  . Alcohol use: Yes    Comment: on rare occasion- maybe twice a year   . Drug use: No  . Sexual activity: Yes    Birth control/protection: IUD  Other Topics Concern  . Not on file  Social History Narrative  . Not on file   Relevant past medical, surgical, family and social history reviewed and updated as indicated. Interim medical history since our last visit reviewed. Allergies and medications reviewed and updated.  Review of Systems  Per HPI unless specifically indicated above     Objective:    BP 102/68 (BP Location: Right Arm, Patient Position: Sitting, Cuff Size: Normal)   Pulse 78   Temp 98.2 F (36.8 C) (Tympanic)   Wt 151 lb 4.8 oz (68.6 kg)   SpO2 98%   BMI 25.97 kg/m   Wt Readings from Last 3 Encounters:  08/21/17 151 lb 4.8 oz (68.6 kg)  08/13/17 149 lb 12 oz (67.9 kg)  07/24/17 149 lb 3.2 oz (67.7 kg)    Physical Exam  Constitutional: She is oriented to person, place, and time. She appears well-developed and well-nourished. No distress.  HENT:  Head: Atraumatic.  Eyes: Conjunctivae are normal. Pupils are equal, round, and reactive to light.  Neck: Normal range of motion. Neck supple.  Cardiovascular: Normal rate and normal heart sounds.  Pulmonary/Chest: Effort normal and breath sounds normal. No respiratory distress.  Musculoskeletal: She exhibits tenderness (left deltoid). She exhibits no edema or deformity.  Limitations to overhead extension but otherwise decent ROM, especially passive Significant decreased strength to resistance left arm  Neurological: She is alert and oriented to person, place, and time.  Psychiatric: She has a normal mood and affect. Her behavior is normal.  Nursing note and vitals reviewed.  Results for orders placed or performed in visit on 07/03/17  CBC With  Differential/Platelet  Result Value Ref Range   WBC 11.2 (H) 3.4 - 10.8 x10E3/uL   RBC 4.52 3.77 - 5.28 x10E6/uL   Hemoglobin 14.9 11.1 - 15.9 g/dL   Hematocrit 42.3 34.0 - 46.6 %   MCV 94 79 - 97 fL   MCH 33.0 26.6 - 33.0 pg   MCHC 35.2 31.5 - 35.7 g/dL   RDW 13.5 12.3 - 15.4 %   Platelets 286 150 - 379 x10E3/uL   Neutrophils 72 Not Estab. %   Lymphs 24 Not Estab. %   MID 4 Not Estab. %   Neutrophils Absolute 8.0 (H) 1.4 - 7.0 x10E3/uL   Lymphocytes Absolute 2.7 0.7 - 3.1 x10E3/uL   MID (Absolute) 0.5 0.1 - 1.6 X10E3/uL  Comprehensive metabolic panel  Result Value Ref Range   Glucose 103 (H) 65 - 99 mg/dL   BUN 8 6 - 24 mg/dL   Creatinine, Ser 0.96 0.57 - 1.00 mg/dL   GFR calc non Af Amer 73 >59 mL/min/1.73   GFR calc Af Amer 84 >59 mL/min/1.73   BUN/Creatinine Ratio 8 (L) 9 - 23   Sodium 142 134 - 144 mmol/L   Potassium 4.9 3.5 - 5.2 mmol/L   Chloride 105 96 - 106 mmol/L   CO2 21 20 - 29 mmol/L   Calcium 9.9 8.7 - 10.2 mg/dL   Total Protein 7.2 6.0 - 8.5 g/dL   Albumin 4.5 3.5 - 5.5 g/dL   Globulin, Total 2.7 1.5 - 4.5 g/dL   Albumin/Globulin Ratio 1.7 1.2 - 2.2   Bilirubin Total 0.5 0.0 - 1.2 mg/dL   Alkaline Phosphatase 57 39 - 117 IU/L   AST 11 0 - 40 IU/L   ALT 7 0 - 32 IU/L  TSH  Result Value Ref Range   TSH 3.640 0.450 - 4.500 uIU/mL  Amylase  Result Value Ref Range   Amylase 41 31 - 124 U/L  Lipase  Result Value Ref Range   Lipase 14 14 - 72 U/L  Specimen status report  Result Value Ref Range   specimen status report Comment       Assessment & Plan:   Problem List Items Addressed This Visit      Other   Depression, major, single episode, severe ([place])    Long discussion about options, will work on getting switched to trintellix due to side effect profile being more favorable and having failed multiple SSRIs in the past. Continue celexa in meantime, then can make direct switch. Will titrate up as needed       Relevant Medications   vortioxetine HBr  (TRINTELLIX) 10 MG TABS tablet   citalopram (CELEXA) 20 MG tablet    Other Visit Diagnoses    Chronic left shoulder pain    -  Primary   Will obtain preliminary x-ray  of left shoulder and get referral to orthopedics going for further imaging/eval as needed. Lidocaine patches, NSAIDs, heat    Relevant Medications   vortioxetine HBr (TRINTELLIX) 10 MG TABS tablet   citalopram (CELEXA) 20 MG tablet   Other Relevant Orders   DG Shoulder Left (Completed)   AMB referral to orthopedics       Follow up plan: Return in about 4 weeks (around 09/18/2017) for Mood f/u.

## 2017-08-24 NOTE — Assessment & Plan Note (Signed)
Long discussion about options, will work on getting switched to trintellix due to side effect profile being more favorable and having failed multiple SSRIs in the past. Continue celexa in meantime, then can make direct switch. Will titrate up as needed

## 2017-08-24 NOTE — Patient Instructions (Signed)
Follow up in 1 month   

## 2017-09-01 ENCOUNTER — Ambulatory Visit: Payer: 59 | Admitting: Unknown Physician Specialty

## 2017-09-01 ENCOUNTER — Encounter: Payer: Self-pay | Admitting: Unknown Physician Specialty

## 2017-09-01 ENCOUNTER — Ambulatory Visit
Admission: RE | Admit: 2017-09-01 | Discharge: 2017-09-01 | Disposition: A | Discharge status: Home / Self Care | Payer: 59 | Source: Ambulatory Visit | Attending: Unknown Physician Specialty | Admitting: Unknown Physician Specialty

## 2017-09-01 VITALS — BP 109/76 | HR 73 | Temp 98.5°F | Ht 64.0 in | Wt 159.2 lb

## 2017-09-01 DIAGNOSIS — K140 Glossitis: Secondary | ICD-10-CM

## 2017-09-01 DIAGNOSIS — R059 Cough, unspecified: Secondary | ICD-10-CM

## 2017-09-01 DIAGNOSIS — J029 Acute pharyngitis, unspecified: Secondary | ICD-10-CM | POA: Diagnosis not present

## 2017-09-01 DIAGNOSIS — R05 Cough: Secondary | ICD-10-CM

## 2017-09-01 MED ORDER — LIDOCAINE VISCOUS 2 % MT SOLN: 20.0000 mL | OROMUCOSAL | 0 refills | Status: DC | PRN

## 2017-09-01 MED ORDER — HYDROCOD POLST-CPM POLST ER 10-8 MG/5ML PO SUER: 5.0000 mL | Freq: Two times a day (BID) | ORAL | 0 refills | Status: DC | PRN

## 2017-09-01 MED ORDER — DOXYCYCLINE HYCLATE 100 MG PO TABS: 100.0000 mg | ORAL_TABLET | Freq: Two times a day (BID) | ORAL | 0 refills | Status: DC

## 2017-09-01 MED ORDER — FLUCONAZOLE 150 MG PO TABS: 150.0000 mg | ORAL_TABLET | Freq: Once | ORAL | 0 refills | Status: AC

## 2017-09-01 NOTE — Progress Notes (Signed)
BP 109/76   Pulse 73   Temp 98.5 F (36.9 C) (Oral)   Ht 5\' 4"  (1.626 m)   Wt 159 lb 3.2 oz (72.2 kg)   SpO2 100%   BMI 27.33 kg/m    Subjective:    [name], female    DOB: 1976/05/08, 42 y.o.   MRN: [id number]  [name] is a 42 y.o. female  Chief Complaint  Patient presents with  . Sore Throat    pt states she has had a sore throat and vomiting since Sunday morning   Sore Throat   This is a new problem. Episode onset: 2 days. The problem has been unchanged. There has been no fever. The patient is experiencing no pain. Associated symptoms include abdominal pain, congestion, headaches, a hoarse voice, trouble swallowing and vomiting. Pertinent negatives include no coughing, diarrhea, drooling, ear discharge, ear pain, shortness of breath, stridor or swollen glands. Associated symptoms comments: "It has gone to my chest" and now has  cough. She has tried nothing for the symptoms.     Relevant past medical, surgical, family and social history reviewed and updated as indicated. Interim medical history since our last visit reviewed. Allergies and medications reviewed and updated.  Review of Systems  HENT: Positive for congestion, hoarse voice and trouble swallowing. Negative for drooling, ear discharge and ear pain.   Respiratory: Negative for cough, shortness of breath and stridor.   Gastrointestinal: Positive for abdominal pain and vomiting. Negative for diarrhea.  Neurological: Positive for headaches.    Per HPI unless specifically indicated above     Objective:    BP 109/76   Pulse 73   Temp 98.5 F (36.9 C) (Oral)   Ht 5\' 4"  (1.626 m)   Wt 159 lb 3.2 oz (72.2 kg)   SpO2 100%   BMI 27.33 kg/m   Wt Readings from Last 3 Encounters:  09/01/17 159 lb 3.2 oz (72.2 kg)  08/21/17 151 lb 4.8 oz (68.6 kg)  08/13/17 149 lb 12 oz (67.9 kg)    Physical Exam  Constitutional: She is oriented to person, place, and time. She appears  well-developed and well-nourished. No distress.  HENT:  Head: Normocephalic and atraumatic.  Right Ear: Tympanic membrane and ear canal normal.  Left Ear: Tympanic membrane and ear canal normal.  Nose: Rhinorrhea present. Right sinus exhibits no maxillary sinus tenderness and no frontal sinus tenderness. Left sinus exhibits no maxillary sinus tenderness and no frontal sinus tenderness.  Mouth/Throat: Mucous membranes are normal. Posterior oropharyngeal erythema present.  Geographic tongue.  Irritated and raw  Eyes: Conjunctivae and lids are normal. Right eye exhibits no discharge. Left eye exhibits no discharge. No scleral icterus.  Cardiovascular: Normal rate and regular rhythm.  Pulmonary/Chest: Effort normal. No respiratory distress. She has wheezes in the right middle field and the right lower field. She has rhonchi in the right middle field and the right lower field.  Abdominal: Normal appearance. There is no splenomegaly or hepatomegaly.  Musculoskeletal: Normal range of motion.  Neurological: She is alert and oriented to person, place, and time.  Skin: Skin is intact. No rash noted. No pallor.  Psychiatric: She has a normal mood and affect. Her behavior is normal. Judgment and thought content normal.    Results for orders placed or performed in visit on 07/03/17  CBC With Differential/Platelet  Result Value Ref Range   WBC 11.2 (H) 3.4 - 10.8 x10E3/uL   RBC 4.52 3.77 - 5.28 x10E6/uL  Hemoglobin 14.9 11.1 - 15.9 g/dL   Hematocrit 42.3 34.0 - 46.6 %   MCV 94 79 - 97 fL   MCH 33.0 26.6 - 33.0 pg   MCHC 35.2 31.5 - 35.7 g/dL   RDW 13.5 12.3 - 15.4 %   Platelets 286 150 - 379 x10E3/uL   Neutrophils 72 Not Estab. %   Lymphs 24 Not Estab. %   MID 4 Not Estab. %   Neutrophils Absolute 8.0 (H) 1.4 - 7.0 x10E3/uL   Lymphocytes Absolute 2.7 0.7 - 3.1 x10E3/uL   MID (Absolute) 0.5 0.1 - 1.6 X10E3/uL  Comprehensive metabolic panel  Result Value Ref Range   Glucose 103 (H) 65 - 99  mg/dL   BUN 8 6 - 24 mg/dL   Creatinine, Ser 0.96 0.57 - 1.00 mg/dL   GFR calc non Af Amer 73 >59 mL/min/1.73   GFR calc Af Amer 84 >59 mL/min/1.73   BUN/Creatinine Ratio 8 (L) 9 - 23   Sodium 142 134 - 144 mmol/L   Potassium 4.9 3.5 - 5.2 mmol/L   Chloride 105 96 - 106 mmol/L   CO2 21 20 - 29 mmol/L   Calcium 9.9 8.7 - 10.2 mg/dL   Total Protein 7.2 6.0 - 8.5 g/dL   Albumin 4.5 3.5 - 5.5 g/dL   Globulin, Total 2.7 1.5 - 4.5 g/dL   Albumin/Globulin Ratio 1.7 1.2 - 2.2   Bilirubin Total 0.5 0.0 - 1.2 mg/dL   Alkaline Phosphatase 57 39 - 117 IU/L   AST 11 0 - 40 IU/L   ALT 7 0 - 32 IU/L  TSH  Result Value Ref Range   TSH 3.640 0.450 - 4.500 uIU/mL  Amylase  Result Value Ref Range   Amylase 41 31 - 124 U/L  Lipase  Result Value Ref Range   Lipase 14 14 - 72 U/L  Specimen status report  Result Value Ref Range   specimen status report Comment       Assessment & Plan:   Problem List Items Addressed This Visit    None    Visit Diagnoses    Sore throat    -  Primary   Probably viral.  Will rx with viscous lidocaine.     Relevant Orders   Rapid Strep Screen (Not at [facility])   Glossitis       Nystatin on back-order.  Rx for Diflucan 150 mg   Cough       Pt with cough and adventitious sounds.  Rx for Tussinex 1/2 to 1 tsp Q 12 hours prn.  Doxycycline 100 mg BID to r/o pneumonia.  Chest x-ray   Relevant Orders   DG Chest 2 View       Follow up plan: Return if symptoms worsen or fail to improve.

## 2017-09-02 ENCOUNTER — Telehealth: Payer: Self-pay | Admitting: Cardiovascular Disease

## 2017-09-02 NOTE — Telephone Encounter (Signed)
Reviewed GXT instructions with patient who verbalized understanding.  Reminder her to bring albuterol inhaler but patient states she is out and is also out of spiriva but states "I'm ok without it".

## 2017-09-03 ENCOUNTER — Other Ambulatory Visit: Payer: Self-pay

## 2017-09-03 ENCOUNTER — Ambulatory Visit (INDEPENDENT_AMBULATORY_CARE_PROVIDER_SITE_OTHER): Payer: 59

## 2017-09-03 DIAGNOSIS — R079 Chest pain, unspecified: Secondary | ICD-10-CM

## 2017-09-03 DIAGNOSIS — R0602 Shortness of breath: Secondary | ICD-10-CM | POA: Diagnosis not present

## 2017-09-04 LAB — EXERCISE TOLERANCE TEST
CHL CUP MPHR: 177 {beats}/min
CHL CUP RESTING HR STRESS: 75 {beats}/min
CHL RATE OF PERCEIVED EXERTION: 13
CSEPEW: 11.6 METS
Exercise duration (min): 9 min
Exercise duration (sec): 56 s
Peak HR: 162 {beats}/min
Percent HR: 91 %

## 2017-09-04 LAB — CULTURE, GROUP A STREP: Strep A Culture: NEGATIVE

## 2017-09-04 LAB — RAPID STREP SCREEN (MED CTR MEBANE ONLY): Strep Gp A Ag, IA W/Reflex: NEGATIVE

## 2017-09-11 ENCOUNTER — Encounter: Payer: Self-pay | Admitting: Unknown Physician Specialty

## 2017-09-18 ENCOUNTER — Ambulatory Visit: Payer: 59 | Admitting: Unknown Physician Specialty

## 2017-09-18 ENCOUNTER — Encounter: Payer: Self-pay | Admitting: Unknown Physician Specialty

## 2017-09-18 DIAGNOSIS — F325 Major depressive disorder, single episode, in full remission: Secondary | ICD-10-CM | POA: Diagnosis not present

## 2017-09-18 DIAGNOSIS — F410 Panic disorder [episodic paroxysmal anxiety] without agoraphobia: Secondary | ICD-10-CM

## 2017-09-18 MED ORDER — VORTIOXETINE HBR 10 MG PO TABS: 10.0000 mg | ORAL_TABLET | Freq: Every day | ORAL | 1 refills | Status: DC

## 2017-09-18 NOTE — Assessment & Plan Note (Signed)
Currently symptoms controlled with Trintellix

## 2017-09-18 NOTE — Assessment & Plan Note (Signed)
States they are "slight" and able to control with "my thoughts."

## 2017-09-18 NOTE — Progress Notes (Signed)
   BP 109/78   Pulse 76   Temp 98.2 F (36.8 C) (Oral)   Ht 5\' 4"  (1.626 m)   Wt 154 lb 9.6 oz (70.1 kg)   SpO2 96%   BMI 26.54 kg/m    Subjective:    [name], female    DOB: 1976/03/21, 42 y.o.   MRN: [id number]  [name] is a 42 y.o. female  Chief Complaint  Patient presents with  . Depression   Depression Pt states her anxiety and depression is doing sell on Trintellix.  Citalopram stopped due to sexual side effects.  Failed multiple other SSRIs.  Panic attacks are slight and able to manage through cognitive measures Depression screen [facility] 2/9 09/18/2017 07/24/2017 07/03/2017 01/05/2017  Decreased Interest 0 1 3 0  Down, Depressed, Hopeless 0 0 3 0  PHQ - 2 Score 0 1 6 0  Altered sleeping 1 1 3  0  Tired, decreased energy 1 1 3 1   Change in appetite 0 2 3 1   Feeling bad or failure about yourself  0 2 3 1   Trouble concentrating 0 0 3 0  Moving slowly or fidgety/restless 0 0 2 0  Suicidal thoughts 0 0 0 0  PHQ-9 Score 2 7 23 3      Relevant past medical, surgical, family and social history reviewed and updated as indicated. Interim medical history since our last visit reviewed. Allergies and medications reviewed and updated.  Review of Systems  Per HPI unless specifically indicated above     Objective:    BP 109/78   Pulse 76   Temp 98.2 F (36.8 C) (Oral)   Ht 5\' 4"  (1.626 m)   Wt 154 lb 9.6 oz (70.1 kg)   SpO2 96%   BMI 26.54 kg/m   Wt Readings from Last 3 Encounters:  09/18/17 154 lb 9.6 oz (70.1 kg)  09/01/17 159 lb 3.2 oz (72.2 kg)  08/21/17 151 lb 4.8 oz (68.6 kg)    Physical Exam  Results for orders placed or performed in visit on 09/03/17  Exercise Tolerance Test  Result Value Ref Range   Rest HR 75 bpm   Rest BP 106/77 mmHg   RPE 13    Exercise duration (sec) 56 sec   Percent HR 91 %   Exercise duration (min) 9 min   Estimated workload 11.6 METS   Peak HR 162 bpm   Peak BP 126/69 mmHg   MPHR 177 bpm        Assessment & Plan:   Problem List Items Addressed This Visit      Unprioritized   Major depressive disorder, single episode, in remission ([place])    Currently symptoms controlled with Trintellix      Relevant Medications   vortioxetine HBr (TRINTELLIX) 10 MG TABS tablet   Panic    States they are "slight" and able to control with "my thoughts."      Relevant Medications   vortioxetine HBr (TRINTELLIX) 10 MG TABS tablet       Follow up plan: Return in about 6 months (around 03/20/2018).

## 2017-10-05 ENCOUNTER — Encounter: Payer: Self-pay | Admitting: Family Medicine

## 2017-10-05 ENCOUNTER — Ambulatory Visit: Payer: 59 | Admitting: Family Medicine

## 2017-10-05 VITALS — BP 120/73 | HR 91 | Temp 99.0°F | Wt 148.7 lb

## 2017-10-05 DIAGNOSIS — R112 Nausea with vomiting, unspecified: Secondary | ICD-10-CM

## 2017-10-05 DIAGNOSIS — R1013 Epigastric pain: Secondary | ICD-10-CM | POA: Diagnosis not present

## 2017-10-05 LAB — CBC WITH DIFFERENTIAL/PLATELET
HEMATOCRIT: 43 % (ref 34.0–46.6)
HEMOGLOBIN: 15.2 g/dL (ref 11.1–15.9)
LYMPHS ABS: 0.6 10*3/uL — AB (ref 0.7–3.1)
LYMPHS: 7 %
MCH: 33.3 pg — ABNORMAL HIGH (ref 26.6–33.0)
MCHC: 35.3 g/dL (ref 31.5–35.7)
MCV: 94 fL (ref 79–97)
MID (Absolute): 0.1 10*3/uL (ref 0.1–1.6)
MID: 1 %
Neutrophils Absolute: 7.2 10*3/uL — ABNORMAL HIGH (ref 1.4–7.0)
Neutrophils: 92 %
Platelets: 242 10*3/uL (ref 150–379)
RBC: 4.56 x10E6/uL (ref 3.77–5.28)
RDW: 14.2 % (ref 12.3–15.4)
WBC: 7.9 10*3/uL (ref 3.4–10.8)

## 2017-10-05 LAB — UA/M W/RFLX CULTURE, ROUTINE
BILIRUBIN UA: NEGATIVE
Glucose, UA: NEGATIVE
Leukocytes, UA: NEGATIVE
Nitrite, UA: NEGATIVE
PH UA: 6 (ref 5.0–7.5)
RBC UA: NEGATIVE
Specific Gravity, UA: >1.03 — ABNORMAL HIGH (ref 1.005–1.030)
Urobilinogen, Ur: 1 mg/dL (ref 0.2–1.0)

## 2017-10-05 LAB — MICROSCOPIC EXAMINATION
BACTERIA UA: NONE SEEN
RBC MICROSCOPIC, UA: NONE SEEN /HPF (ref 0–2)

## 2017-10-05 MED ORDER — ONDANSETRON 4 MG PO TBDP: 4.0000 mg | ORAL_TABLET | Freq: Three times a day (TID) | ORAL | 0 refills | Status: DC | PRN

## 2017-10-05 MED ORDER — PROMETHAZINE HCL 25 MG/ML IJ SOLN
25.0000 mg | Freq: Once | INTRAMUSCULAR | Status: AC
  Administered 2017-10-05: 25 mg via INTRAMUSCULAR

## 2017-10-05 NOTE — Progress Notes (Signed)
BP 120/73 (BP Location: Right Arm, Patient Position: Sitting, Cuff Size: Normal)   Pulse 91   Temp 99 F (37.2 C) (Oral)   Wt 148 lb 11.2 oz (67.4 kg)   SpO2 99%   BMI 25.52 kg/m    Subjective:    [name], female    DOB: 07/17/1975, 42 y.o.   MRN: [id number]  [name] is a 42 y.o. female  Chief Complaint  Patient presents with  . Emesis    x's 10 days. Patient states unable to keep anything down. Daughter was sick, but only lasted for 24 hours. Patient states her symptoms started the day after, but hasn't stopped.  . Nausea    Constant  . Abdominal Pain    Middle of abdomen.   Lots of belching, vomiting with any amount of intake on certain days, epigastric pain, nausea x 10 days. States sxs severity come and go. Some diarrhea for a day or two, now back to normal. Has not been taking anything as she can't keep medications down. Hx of cholecystectomy. No chance of pregnancy, has IUD and hx of right oophorectomy and reports b/l salpingectomy. Denies known fevers, chills, bloody emesis or stools, dizziness, syncope. Daughter was sick with same sxs recently, but hers seemed to last 24 hours only.   Relevant past medical, surgical, family and social history reviewed and updated as indicated. Interim medical history since our last visit reviewed. Allergies and medications reviewed and updated.  Review of Systems  Per HPI unless specifically indicated above     Objective:    BP 120/73 (BP Location: Right Arm, Patient Position: Sitting, Cuff Size: Normal)   Pulse 91   Temp 99 F (37.2 C) (Oral)   Wt 148 lb 11.2 oz (67.4 kg)   SpO2 99%   BMI 25.52 kg/m   Wt Readings from Last 3 Encounters:  10/05/17 148 lb 11.2 oz (67.4 kg)  09/18/17 154 lb 9.6 oz (70.1 kg)  09/01/17 159 lb 3.2 oz (72.2 kg)    Physical Exam  Constitutional: She is oriented to person, place, and time. She appears well-developed and well-nourished. No distress.  HENT:  Head:  Atraumatic.  Eyes: Pupils are equal, round, and reactive to light. Conjunctivae are normal.  Neck: Normal range of motion. Neck supple.  Cardiovascular: Normal rate, regular rhythm and normal heart sounds.  Pulmonary/Chest: Effort normal and breath sounds normal.  Abdominal: She exhibits no distension. Bowel sounds are decreased. There is tenderness (mild upper abdominal ttp, worse on right). There is no CVA tenderness.  Musculoskeletal: Normal range of motion.  Neurological: She is alert and oriented to person, place, and time.  Skin: Skin is warm and dry. No rash noted.  Psychiatric: She has a normal mood and affect. Her behavior is normal.  Nursing note and vitals reviewed.   Results for orders placed or performed in visit on 10/05/17  Microscopic Examination  Result Value Ref Range   WBC, UA 0-5 0 - 5 /hpf   RBC, UA None seen 0 - 2 /hpf   Epithelial Cells (non renal) >10 (A) 0 - 10 /hpf   Mucus, UA Present Not Estab.   Bacteria, UA None seen None seen/Few  CBC With Differential/Platelet  Result Value Ref Range   WBC 7.9 3.4 - 10.8 x10E3/uL   RBC 4.56 3.77 - 5.28 x10E6/uL   Hemoglobin 15.2 11.1 - 15.9 g/dL   Hematocrit 43.0 34.0 - 46.6 %   MCV 94 79 -  97 fL   MCH 33.3 (H) 26.6 - 33.0 pg   MCHC 35.3 31.5 - 35.7 g/dL   RDW 14.2 12.3 - 15.4 %   Platelets 242 150 - 379 x10E3/uL   Neutrophils 92 Not Estab. %   Lymphs 7 Not Estab. %   MID 1 Not Estab. %   Neutrophils Absolute 7.2 (H) 1.4 - 7.0 x10E3/uL   Lymphocytes Absolute 0.6 (L) 0.7 - 3.1 x10E3/uL   MID (Absolute) 0.1 0.1 - 1.6 X10E3/uL  UA/M w/rflx Culture, Routine  Result Value Ref Range   Specific Gravity, UA >1.030 (H) 1.005 - 1.030   pH, UA 6.0 5.0 - 7.5   Color, UA Orange Yellow   Appearance Ur Cloudy (A) Clear   Leukocytes, UA Negative Negative   Protein, UA 2+ (A) Negative/Trace   Glucose, UA Negative Negative   Ketones, UA 1+ (A) Negative   RBC, UA Negative Negative   Bilirubin, UA Negative Negative    Urobilinogen, Ur 1.0 0.2 - 1.0 mg/dL   Nitrite, UA Negative Negative   Microscopic Examination See below:   Comprehensive metabolic panel  Result Value Ref Range   Glucose 112 (H) 65 - 99 mg/dL   BUN 9 6 - 24 mg/dL   Creatinine, Ser 0.78 0.57 - 1.00 mg/dL   GFR calc non Af Amer 93 >59 mL/min/1.73   GFR calc Af Amer 108 >59 mL/min/1.73   BUN/Creatinine Ratio 12 9 - 23   Sodium 142 134 - 144 mmol/L   Potassium 3.7 3.5 - 5.2 mmol/L   Chloride 103 96 - 106 mmol/L   CO2 24 20 - 29 mmol/L   Calcium 9.4 8.7 - 10.2 mg/dL   Total Protein 6.5 6.0 - 8.5 g/dL   Albumin 4.4 3.5 - 5.5 g/dL   Globulin, Total 2.1 1.5 - 4.5 g/dL   Albumin/Globulin Ratio 2.1 1.2 - 2.2   Bilirubin Total 0.5 0.0 - 1.2 mg/dL   Alkaline Phosphatase 51 39 - 117 IU/L   AST 9 0 - 40 IU/L   ALT 11 0 - 32 IU/L  Helicobacter pylori abs-IgG+IgA, bld  Result Value Ref Range   H. pylori, IgA Abs <9.0 0.0 - 8.9 units   H. pylori, IgG AbS <0.80 0.00 - 0.79 Index Value  Lipase  Result Value Ref Range   Lipase 14 14 - 72 U/L      Assessment & Plan:   Problem List Items Addressed This Visit    None    Visit Diagnoses    Nausea and vomiting, intractability of vomiting not specified, unspecified vomiting type    -  Primary   Relevant Medications   promethazine (PHENERGAN) injection 25 mg (Completed)   Other Relevant Orders   CBC With Differential/Platelet (Completed)   UA/M w/rflx Culture, Routine (Completed)   Comprehensive metabolic panel (Completed)   Helicobacter pylori abs-IgG+IgA, bld (Completed)   Lipase (Completed)   Epigastric pain        POC CBC and U/A WNL with no evidence of infection, will await other labs for r/o. IM phenergan today, and zofran prn to help tolerate PO. Return precautions reviewed at length, including fevers, worsening abdominal pain, complete inability to tolerate fluids with zofran on board. Suspect this is a lingering viral illness.  Follow up plan: Return if symptoms worsen or fail to  improve.

## 2017-10-06 ENCOUNTER — Encounter: Payer: Self-pay | Admitting: Family Medicine

## 2017-10-07 ENCOUNTER — Encounter: Payer: Self-pay | Admitting: Family Medicine

## 2017-10-07 LAB — COMPREHENSIVE METABOLIC PANEL
A/G RATIO: 2.1 (ref 1.2–2.2)
ALT: 11 IU/L (ref 0–32)
AST: 9 IU/L (ref 0–40)
Albumin: 4.4 g/dL (ref 3.5–5.5)
Alkaline Phosphatase: 51 IU/L (ref 39–117)
BILIRUBIN TOTAL: 0.5 mg/dL (ref 0.0–1.2)
BUN/Creatinine Ratio: 12 (ref 9–23)
BUN: 9 mg/dL (ref 6–24)
CHLORIDE: 103 mmol/L (ref 96–106)
CO2: 24 mmol/L (ref 20–29)
Calcium: 9.4 mg/dL (ref 8.7–10.2)
Creatinine, Ser: 0.78 mg/dL (ref 0.57–1.00)
GFR calc non Af Amer: 93 mL/min/{1.73_m2} (ref >59)
GFR, EST AFRICAN AMERICAN: 108 mL/min/{1.73_m2} (ref >59)
Globulin, Total: 2.1 g/dL (ref 1.5–4.5)
Glucose: 112 mg/dL — ABNORMAL HIGH (ref 65–99)
POTASSIUM: 3.7 mmol/L (ref 3.5–5.2)
Sodium: 142 mmol/L (ref 134–144)
TOTAL PROTEIN: 6.5 g/dL (ref 6.0–8.5)

## 2017-10-07 LAB — LIPASE: LIPASE: 14 U/L (ref 14–72)

## 2017-10-07 LAB — HELICOBACTER PYLORI ABS-IGG+IGA, BLD
H. pylori, IgA Abs: <9 units (ref 0.0–8.9)
H. pylori, IgG AbS: <0.8 Index Value (ref 0.00–0.79)

## 2017-10-08 ENCOUNTER — Other Ambulatory Visit: Payer: Self-pay | Admitting: Family Medicine

## 2017-10-08 MED ORDER — SUCRALFATE 1 G PO TABS: 1.0000 g | ORAL_TABLET | Freq: Three times a day (TID) | ORAL | 0 refills | Status: DC

## 2017-10-08 NOTE — Patient Instructions (Signed)
Follow up as needed

## 2017-10-09 ENCOUNTER — Telehealth: Payer: Self-pay

## 2017-10-09 NOTE — Telephone Encounter (Signed)
Tried calling patient's husband as requested to pick up his wife's paper work.  No answer and mailbox is full. Will try again later.  Putting form in the pick-up file.

## 2017-12-03 ENCOUNTER — Encounter: Payer: Self-pay | Admitting: Unknown Physician Specialty

## 2017-12-03 MED ORDER — ALBUTEROL SULFATE HFA 108 (90 BASE) MCG/ACT IN AERS: 2.0000 | INHALATION_SPRAY | Freq: Four times a day (QID) | RESPIRATORY_TRACT | 4 refills | Status: DC | PRN

## 2017-12-14 ENCOUNTER — Encounter: Payer: Self-pay | Admitting: Unknown Physician Specialty

## 2017-12-14 LAB — HM MAMMOGRAPHY

## 2018-01-21 ENCOUNTER — Encounter: Payer: Self-pay | Admitting: Family Medicine

## 2018-01-21 MED ORDER — TIOTROPIUM BROMIDE MONOHYDRATE 18 MCG IN CAPS: 18.0000 ug | ORAL_CAPSULE | Freq: Every day | RESPIRATORY_TRACT | 3 refills | Status: DC

## 2018-02-18 ENCOUNTER — Encounter: Payer: Self-pay | Admitting: Family Medicine

## 2018-03-24 ENCOUNTER — Encounter: Payer: Self-pay | Admitting: Emergency Medicine

## 2018-03-26 ENCOUNTER — Encounter: Payer: Self-pay | Admitting: Family Medicine

## 2018-03-26 ENCOUNTER — Ambulatory Visit: Payer: 59 | Admitting: Family Medicine

## 2018-03-26 VITALS — BP 130/87 | HR 64 | Temp 97.9°F | Ht 64.0 in | Wt 151.7 lb

## 2018-03-26 DIAGNOSIS — J449 Chronic obstructive pulmonary disease, unspecified: Secondary | ICD-10-CM | POA: Diagnosis not present

## 2018-03-26 DIAGNOSIS — F325 Major depressive disorder, single episode, in full remission: Secondary | ICD-10-CM

## 2018-03-26 DIAGNOSIS — Z720 Tobacco use: Secondary | ICD-10-CM

## 2018-03-26 DIAGNOSIS — Z23 Encounter for immunization: Secondary | ICD-10-CM | POA: Diagnosis not present

## 2018-03-26 DIAGNOSIS — F410 Panic disorder [episodic paroxysmal anxiety] without agoraphobia: Secondary | ICD-10-CM

## 2018-03-26 DIAGNOSIS — E039 Hypothyroidism, unspecified: Secondary | ICD-10-CM

## 2018-03-26 MED ORDER — UMECLIDINIUM-VILANTEROL 62.5-25 MCG/INH IN AEPB: 1.0000 | INHALATION_SPRAY | Freq: Every day | RESPIRATORY_TRACT | 11 refills | Status: DC

## 2018-03-26 MED ORDER — ONDANSETRON 4 MG PO TBDP: 4.0000 mg | ORAL_TABLET | Freq: Three times a day (TID) | ORAL | 0 refills | Status: DC | PRN

## 2018-03-26 MED ORDER — VORTIOXETINE HBR 10 MG PO TABS: 10.0000 mg | ORAL_TABLET | Freq: Every day | ORAL | 1 refills | Status: DC

## 2018-03-26 MED ORDER — ALBUTEROL SULFATE HFA 108 (90 BASE) MCG/ACT IN AERS: 2.0000 | INHALATION_SPRAY | Freq: Four times a day (QID) | RESPIRATORY_TRACT | 4 refills | Status: DC | PRN

## 2018-03-26 MED ORDER — LORAZEPAM 0.5 MG PO TABS: 0.5000 mg | ORAL_TABLET | ORAL | 0 refills | Status: DC | PRN

## 2018-03-26 MED ORDER — VALACYCLOVIR HCL 500 MG PO TABS: 500.0000 mg | ORAL_TABLET | Freq: Every day | ORAL | 3 refills | Status: DC

## 2018-03-26 NOTE — Assessment & Plan Note (Signed)
Will switch to anoro as she gets it free through her company, continue albuterol prn. Work on smoking cessation.

## 2018-03-26 NOTE — Assessment & Plan Note (Signed)
Doing very well on trintellix, continue current regimen

## 2018-03-26 NOTE — Assessment & Plan Note (Signed)
Will draw a TSH after she's been back on her medication for 1-2 months. Will adjust dose if needed. For now continue current regimen

## 2018-03-26 NOTE — Progress Notes (Signed)
BP 130/87   Pulse 64   Temp 97.9 F (36.6 C) (Oral)   Ht 5\' 4"  (1.626 m)   Wt 151 lb 11.2 oz (68.8 kg)   SpO2 98%   BMI 26.04 kg/m    Subjective:    [name], female    DOB: [birth year], 42 y.o.   MRN: [id number]  [name] is a 42 y.o. female  Chief Complaint  Patient presents with  . COPD  . Depression   Here today for 6 month f/u chronic conditions.  Anxiety and depression - Trintellix working very well for her, rarely having panic attacks now and taking 2-3 tabs of ativan monthly on average for severe episodes. Denies side effects, SI/HI, sleep or appetite concerns.   Has been off synthroid for several months as she forgot to take it, just restarted about a week ago when she realized she was feeling pretty fatigued.   Not ready to quit smoking, but has been tapering down on it to 0.5 packs to 1 pack. No longer using the nicotine patches. Using spiriva and albuterol for her COPD without recent flares. Wanting to switch to anoro as she gets it free from work.   Taking valtrex daily for HSV suppression. No recent flares.    Depression screen [facility] 2/9 03/26/2018 09/18/2017 07/24/2017  Decreased Interest 0 0 1  Down, Depressed, Hopeless 0 0 0  PHQ - 2 Score 0 0 1  Altered sleeping 1 1 1   Tired, decreased energy 1 1 1   Change in appetite 1 0 2  Feeling bad or failure about yourself  0 0 2  Trouble concentrating 0 0 0  Moving slowly or fidgety/restless 0 0 0  Suicidal thoughts 0 0 0  PHQ-9 Score 3 2 7    GAD 7 : Generalized Anxiety Score 03/26/2018  Nervous, Anxious, on Edge 1  Control/stop worrying 0  Worry too much - different things 0  Trouble relaxing 0  Restless 1  Easily annoyed or irritable 0  Afraid - awful might happen 2  Total GAD 7 Score 4    Past Medical History:  Diagnosis Date  . Abdominal pain   . Acute foot pain   . Anxiety   . Asthma   . COPD (chronic obstructive pulmonary disease) ([place])   . Cough   . Cyst,  dermoid, face   . Dizziness   . Fatigue   . Goiter   . Hashimoto's disease   . Hypothyroidism   . Microscopic hematuria   . Nevus   . Pain and swelling of right knee   . Shortness of breath at rest   . Thyroid disease    hypothyroid  . Urinary frequency   . Vitamin D deficiency    Social History   Socioeconomic History  . Marital status: Married    Spouse name: Not on file  . Number of children: Not on file  . Years of education: Not on file  . Highest education level: Not on file  Occupational History  . Not on file  Social Needs  . Financial resource strain: Not on file  . Food insecurity:    Worry: Not on file    Inability: Not on file  . Transportation needs:    Medical: Not on file    Non-medical: Not on file  Tobacco Use  . Smoking status: Current Every Day Smoker    Packs/day: 1.00    Types: Cigarettes  . Smokeless tobacco: Never  Used  Substance and Sexual Activity  . Alcohol use: Yes    Comment: on rare occasion- maybe twice a year   . Drug use: No  . Sexual activity: Yes    Birth control/protection: IUD  Lifestyle  . Physical activity:    Days per week: Not on file    Minutes per session: Not on file  . Stress: Not on file  Relationships  . Social connections:    Talks on phone: Not on file    Gets together: Not on file    Attends religious service: Not on file    Active member of club or organization: Not on file    Attends meetings of clubs or organizations: Not on file    Relationship status: Not on file  . Intimate partner violence:    Fear of current or ex partner: Not on file    Emotionally abused: Not on file    Physically abused: Not on file    Forced sexual activity: Not on file  Other Topics Concern  . Not on file  Social History Narrative   ** Merged History Encounter **       Relevant past medical, surgical, family and social history reviewed and updated as indicated. Interim medical history since our last visit  reviewed. Allergies and medications reviewed and updated.  Review of Systems  Per HPI unless specifically indicated above     Objective:    BP 130/87   Pulse 64   Temp 97.9 F (36.6 C) (Oral)   Ht 5\' 4"  (1.626 m)   Wt 151 lb 11.2 oz (68.8 kg)   SpO2 98%   BMI 26.04 kg/m   Wt Readings from Last 3 Encounters:  03/26/18 151 lb 11.2 oz (68.8 kg)  10/05/17 148 lb 11.2 oz (67.4 kg)  09/18/17 154 lb 9.6 oz (70.1 kg)    Physical Exam  Constitutional: She is oriented to person, place, and time. She appears well-developed and well-nourished.  HENT:  Head: Atraumatic.  Eyes: Conjunctivae and EOM are normal.  Neck: Normal range of motion. Neck supple. No thyromegaly present.  Cardiovascular: Normal rate, regular rhythm and normal heart sounds.  Pulmonary/Chest: Effort normal and breath sounds normal.  Musculoskeletal: Normal range of motion.  Neurological: She is alert and oriented to person, place, and time.  Skin: Skin is warm and dry.  Psychiatric: She has a normal mood and affect. Her behavior is normal.  Nursing note and vitals reviewed.  Results for orders placed or performed in visit on 12/25/17  HM MAMMOGRAPHY  Result Value Ref Range   HM Mammogram 0-4 Bi-Rad 0-4 Bi-Rad, Self Reported Normal      Assessment & Plan:   Problem List Items Addressed This Visit      Respiratory   COPD, moderate ([place]) - Primary    Will switch to anoro as she gets it free through her company, continue albuterol prn. Work on smoking cessation.       Relevant Medications   umeclidinium-vilanterol (ANORO ELLIPTA) 62.5-25 MCG/INH AEPB   albuterol (VENTOLIN HFA) 108 (90 Base) MCG/ACT inhaler     Endocrine   Hypothyroid    Will draw a TSH after she's been back on her medication for 1-2 months. Will adjust dose if needed. For now continue current regimen      Relevant Orders   TSH     Other   Tobacco abuse    Not ready to quit, continue to work on cutting back on quantity  Major depressive disorder, single episode, in remission [facility])    Doing very well on trintellix, continue current regimen      Relevant Medications   LORazepam (ATIVAN) 0.5 MG tablet   vortioxetine HBr (TRINTELLIX) 10 MG TABS tablet   Panic disorder    Doing much better on the trintellix, only having to use 2-3 tabs of her lorazepam monthly on average. Continue current regimen      Relevant Medications   LORazepam (ATIVAN) 0.5 MG tablet   vortioxetine HBr (TRINTELLIX) 10 MG TABS tablet    Other Visit Diagnoses    Need for influenza vaccination       Relevant Orders   Flu Vaccine QUAD 36+ mos IM (Completed)       Follow up plan: Return in about 6 months (around 09/25/2018) for CPE.

## 2018-03-26 NOTE — Patient Instructions (Signed)
Follow up in 6 months 

## 2018-03-26 NOTE — Assessment & Plan Note (Signed)
Not ready to quit, continue to work on cutting back on quantity

## 2018-03-26 NOTE — Assessment & Plan Note (Signed)
Doing much better on the trintellix, only having to use 2-3 tabs of her lorazepam monthly on average. Continue current regimen

## 2018-07-02 ENCOUNTER — Encounter: Payer: Self-pay | Admitting: Family Medicine

## 2018-07-02 ENCOUNTER — Ambulatory Visit: Payer: 59 | Admitting: Family Medicine

## 2018-07-02 VITALS — BP 113/77 | HR 72 | Temp 98.2°F | Wt 159.4 lb

## 2018-07-02 DIAGNOSIS — R252 Cramp and spasm: Secondary | ICD-10-CM

## 2018-07-02 DIAGNOSIS — E039 Hypothyroidism, unspecified: Secondary | ICD-10-CM

## 2018-07-02 NOTE — Assessment & Plan Note (Signed)
Back on medicines daily now, recheck TSH and adjust as needed

## 2018-07-02 NOTE — Progress Notes (Signed)
BP 113/77   Pulse 72   Temp 98.2 F (36.8 C) (Oral)   Wt 159 lb 6.4 oz (72.3 kg)   SpO2 97%   BMI 27.36 kg/m    Subjective:    [name], female    DOB: Jul 27, 1975, 43 y.o.   MRN: [id number]  [name] is a 42 y.o. female  Chief Complaint  Patient presents with  . Muscle Pain    pt is requesting to have her potassium checked. States she has been having muscle cramps and fatigue    Here today requesting to have her electrolytes checked as she's been having some intermittent leg cramping as well as tingly and "crawly" sensations in her legs that are worst when she's laying down to go to sleep. Drinking plenty of fluids, eating a balanced diet. Denies fevers, chills, CP, SOB, point tenderness in the calves, erythema. Not trying anything OTC for sxs.   Relevant past medical, surgical, family and social history reviewed and updated as indicated. Interim medical history since our last visit reviewed. Allergies and medications reviewed and updated.  Review of Systems  Per HPI unless specifically indicated above     Objective:    BP 113/77   Pulse 72   Temp 98.2 F (36.8 C) (Oral)   Wt 159 lb 6.4 oz (72.3 kg)   SpO2 97%   BMI 27.36 kg/m   Wt Readings from Last 3 Encounters:  07/02/18 159 lb 6.4 oz (72.3 kg)  03/26/18 151 lb 11.2 oz (68.8 kg)  10/05/17 148 lb 11.2 oz (67.4 kg)    Physical Exam Vitals signs and nursing note reviewed.  Constitutional:      Appearance: Normal appearance. She is not ill-appearing.  HENT:     Head: Atraumatic.  Eyes:     Extraocular Movements: Extraocular movements intact.     Conjunctiva/sclera: Conjunctivae normal.  Neck:     Musculoskeletal: Normal range of motion and neck supple.  Cardiovascular:     Rate and Rhythm: Normal rate and regular rhythm.     Pulses: Normal pulses.     Heart sounds: Normal heart sounds.  Pulmonary:     Effort: Pulmonary effort is normal.     Breath sounds: Normal breath  sounds.  Musculoskeletal: Normal range of motion.        General: No swelling or tenderness.     Comments: Neg squeeze test b/l calves - homan's sign  Skin:    General: Skin is warm and dry.  Neurological:     Mental Status: She is alert and oriented to person, place, and time.  Psychiatric:        Mood and Affect: Mood normal.        Thought Content: Thought content normal.        Judgment: Judgment normal.     Results for orders placed or performed in visit on 12/25/17  HM MAMMOGRAPHY  Result Value Ref Range   HM Mammogram 0-4 Bi-Rad 0-4 Bi-Rad, Self Reported Normal      Assessment & Plan:   Problem List Items Addressed This Visit      Endocrine   Hypothyroid    Back on medicines daily now, recheck TSH and adjust as needed      Relevant Orders   TSH    Other Visit Diagnoses    Muscle cramp    -  Primary   Will check electrolytes. Suspect RLS, will trial gabapentin if normal electrolytes. Push fluids, stretch  Relevant Orders   Basic Metabolic Panel (BMET)       Follow up plan: Return for as scheduled.

## 2018-07-03 LAB — BASIC METABOLIC PANEL
BUN/Creatinine Ratio: 9 (ref 9–23)
BUN: 10 mg/dL (ref 6–24)
CALCIUM: 9.8 mg/dL (ref 8.7–10.2)
CO2: 24 mmol/L (ref 20–29)
Chloride: 99 mmol/L (ref 96–106)
Creatinine, Ser: 1.13 mg/dL — ABNORMAL HIGH (ref 0.57–1.00)
GFR, EST AFRICAN AMERICAN: 68 mL/min/{1.73_m2} (ref >59)
GFR, EST NON AFRICAN AMERICAN: 59 mL/min/{1.73_m2} — AB (ref >59)
Glucose: 79 mg/dL (ref 65–99)
Potassium: 4.4 mmol/L (ref 3.5–5.2)
Sodium: 137 mmol/L (ref 134–144)

## 2018-07-03 LAB — TSH: TSH: 5.86 u[IU]/mL — ABNORMAL HIGH (ref 0.450–4.500)

## 2018-07-05 ENCOUNTER — Other Ambulatory Visit: Payer: Self-pay | Admitting: Family Medicine

## 2018-07-05 MED ORDER — LEVOTHYROXINE SODIUM 150 MCG PO TABS: 150.0000 ug | ORAL_TABLET | Freq: Every day | ORAL | 1 refills | Status: DC

## 2018-07-09 ENCOUNTER — Other Ambulatory Visit: Payer: Self-pay | Admitting: Family Medicine

## 2018-07-09 ENCOUNTER — Encounter: Payer: Self-pay | Admitting: Family Medicine

## 2018-07-16 ENCOUNTER — Ambulatory Visit: Payer: 59 | Admitting: Family Medicine

## 2018-07-16 ENCOUNTER — Encounter: Payer: Self-pay | Admitting: Family Medicine

## 2018-07-16 VITALS — BP 135/89 | HR 84 | Temp 98.5°F | Wt 160.1 lb

## 2018-07-16 DIAGNOSIS — F410 Panic disorder [episodic paroxysmal anxiety] without agoraphobia: Secondary | ICD-10-CM

## 2018-07-16 DIAGNOSIS — F325 Major depressive disorder, single episode, in full remission: Secondary | ICD-10-CM

## 2018-07-16 DIAGNOSIS — R52 Pain, unspecified: Secondary | ICD-10-CM

## 2018-07-16 MED ORDER — LORAZEPAM 0.5 MG PO TABS: 0.5000 mg | ORAL_TABLET | ORAL | 0 refills | Status: DC | PRN

## 2018-07-16 MED ORDER — GABAPENTIN 300 MG PO CAPS: 300.0000 mg | ORAL_CAPSULE | Freq: Three times a day (TID) | ORAL | 0 refills | Status: DC

## 2018-07-16 NOTE — Progress Notes (Signed)
BP 135/89   Pulse 84   Temp 98.5 F (36.9 C) (Oral)   Wt 160 lb 1.6 oz (72.6 kg)   SpO2 96%   BMI 27.48 kg/m    Subjective:    [name], female    DOB: May 30, 1976, 43 y.o.   MRN: [id number]  [name] is a 43 y.o. female  Chief Complaint  Patient presents with  . Anxiety   Increased anxiety the past few months, wondering if she's going into menopause. Originally was doing very well on trintellix and only needing to take her ativan about 2 times monthly, lately it's been more like 10 times monthly so she's running out early. Continues to have cramping and muscle aching all over her body, feeling hot flashes and sweats, mood changes, not sleeping well at all. Denies SI/HI.   Depression screen [facility] 2/9 07/16/2018 03/26/2018 09/18/2017  Decreased Interest 1 0 0  Down, Depressed, Hopeless 0 0 0  PHQ - 2 Score 1 0 0  Altered sleeping 3 1 1   Tired, decreased energy 1 1 1   Change in appetite 1 1 0  Feeling bad or failure about yourself  0 0 0  Trouble concentrating 1 0 0  Moving slowly or fidgety/restless 2 0 0  Suicidal thoughts 0 0 0  PHQ-9 Score 9 3 2    GAD 7 : Generalized Anxiety Score 07/16/2018 03/26/2018  Nervous, Anxious, on Edge 2 1  Control/stop worrying 2 0  Worry too much - different things 2 0  Trouble relaxing 2 0  Restless 1 1  Easily annoyed or irritable 2 0  Afraid - awful might happen 2 2  Total GAD 7 Score 13 4  Anxiety Difficulty Somewhat difficult -   Relevant past medical, surgical, family and social history reviewed and updated as indicated. Interim medical history since our last visit reviewed. Allergies and medications reviewed and updated.  Review of Systems  Per HPI unless specifically indicated above     Objective:    BP 135/89   Pulse 84   Temp 98.5 F (36.9 C) (Oral)   Wt 160 lb 1.6 oz (72.6 kg)   SpO2 96%   BMI 27.48 kg/m   Wt Readings from Last 3 Encounters:  07/16/18 160 lb 1.6 oz (72.6 kg)  07/02/18  159 lb 6.4 oz (72.3 kg)  03/26/18 151 lb 11.2 oz (68.8 kg)    Physical Exam Vitals signs and nursing note reviewed.  Constitutional:      Appearance: Normal appearance. She is not ill-appearing.  HENT:     Head: Atraumatic.  Eyes:     Extraocular Movements: Extraocular movements intact.     Conjunctiva/sclera: Conjunctivae normal.  Neck:     Musculoskeletal: Normal range of motion and neck supple.  Cardiovascular:     Rate and Rhythm: Normal rate and regular rhythm.     Heart sounds: Normal heart sounds.  Pulmonary:     Effort: Pulmonary effort is normal.     Breath sounds: Normal breath sounds.  Musculoskeletal: Normal range of motion.  Skin:    General: Skin is warm and dry.  Neurological:     Mental Status: She is alert and oriented to person, place, and time.  Psychiatric:        Mood and Affect: Mood normal.        Thought Content: Thought content normal.        Judgment: Judgment normal.     Results for orders placed  or performed in visit on 71/24/58  Basic Metabolic Panel (BMET)  Result Value Ref Range   Glucose 79 65 - 99 mg/dL   BUN 10 6 - 24 mg/dL   Creatinine, Ser 1.13 (H) 0.57 - 1.00 mg/dL   GFR calc non Af Amer 59 (L) >59 mL/min/1.73   GFR calc Af Amer 68 >59 mL/min/1.73   BUN/Creatinine Ratio 9 9 - 23   Sodium 137 134 - 144 mmol/L   Potassium 4.4 3.5 - 5.2 mmol/L   Chloride 99 96 - 106 mmol/L   CO2 24 20 - 29 mmol/L   Calcium 9.8 8.7 - 10.2 mg/dL  TSH  Result Value Ref Range   TSH 5.860 (H) 0.450 - 4.500 uIU/mL      Assessment & Plan:   Problem List Items Addressed This Visit      Other   Major depressive disorder, single episode, in remission ([place]) - Primary    Continue trintellix      Relevant Medications   LORazepam (ATIVAN) 0.5 MG tablet   Panic disorder    Will increase amount of ativan monthly to about 10 tabs monthly as needed, continue trintellix. WIll work on getting pain and peri menopausal sxs under control and monitor for  benefit to her anxiety      Relevant Medications   LORazepam (ATIVAN) 0.5 MG tablet    Other Visit Diagnoses    Body aches       Basic labs all normal recently, exam benign. Potentially fibromyalgia - will start gabapentin and monitor for benefit       Follow up plan: Return in about 4 weeks (around 08/13/2018) for Anxiety, cramping.

## 2018-07-16 NOTE — Assessment & Plan Note (Signed)
Will increase amount of ativan monthly to about 10 tabs monthly as needed, continue trintellix. WIll work on getting pain and peri menopausal sxs under control and monitor for benefit to her anxiety

## 2018-07-16 NOTE — Assessment & Plan Note (Signed)
Continue trintellix 

## 2018-08-13 ENCOUNTER — Encounter: Payer: Self-pay | Admitting: Family Medicine

## 2018-08-13 ENCOUNTER — Ambulatory Visit: Payer: 59 | Admitting: Family Medicine

## 2018-08-13 VITALS — BP 117/83 | HR 79 | Temp 98.4°F | Wt 164.2 lb

## 2018-08-13 DIAGNOSIS — F410 Panic disorder [episodic paroxysmal anxiety] without agoraphobia: Secondary | ICD-10-CM | POA: Diagnosis not present

## 2018-08-13 DIAGNOSIS — E039 Hypothyroidism, unspecified: Secondary | ICD-10-CM

## 2018-08-13 DIAGNOSIS — R4789 Other speech disturbances: Secondary | ICD-10-CM

## 2018-08-13 MED ORDER — VORTIOXETINE HBR 20 MG PO TABS: 20.0000 mg | ORAL_TABLET | Freq: Every day | ORAL | 1 refills | Status: DC

## 2018-08-13 NOTE — Progress Notes (Signed)
BP 117/83   Pulse 79   Temp 98.4 F (36.9 C) (Oral)   Wt 164 lb 3.2 oz (74.5 kg)   SpO2 98%   BMI 28.18 kg/m    Subjective:    [name], female    DOB: [birth year], 43 y.o.   MRN: [id number]  [name] is a 43 y.o. female  Chief Complaint  Patient presents with  . Anxiety    4 week f/up   Here today for 1 month anxiety f/u. Trintellix increased at last visit which may be helping some. Still having to use about 10 or so ativan monthly for breakthrough anxiety. Still not sleeping but any medication she's tried for sleep has made her too groggy in the mornings as she wakes at 3 to get ready for work daily. Does not want to try any more for sleep. Denies SI/HI, appetite changes.   Also concerned about some speech issues she's been noticing intermittently the past year or so. Will know what she wants to say but other words or jibberish will come out. No gait issues, confused thoughts, extremity numbness or tingling, visual issues. Does not notice a particular pattern as to when it's happening to her. Wanting further evaluation into this.   Synthroid increased to 150 mcg a month or two ago. Tolerating well, asymptomatic.   Relevant past medical, surgical, family and social history reviewed and updated as indicated. Interim medical history since our last visit reviewed. Allergies and medications reviewed and updated.  Review of Systems  Per HPI unless specifically indicated above     Objective:    BP 117/83   Pulse 79   Temp 98.4 F (36.9 C) (Oral)   Wt 164 lb 3.2 oz (74.5 kg)   SpO2 98%   BMI 28.18 kg/m   Wt Readings from Last 3 Encounters:  08/13/18 164 lb 3.2 oz (74.5 kg)  07/16/18 160 lb 1.6 oz (72.6 kg)  07/02/18 159 lb 6.4 oz (72.3 kg)    Physical Exam Vitals signs and nursing note reviewed.  Constitutional:      Appearance: Normal appearance. She is not ill-appearing.  HENT:     Head: Atraumatic.  Eyes:     Extraocular Movements:  Extraocular movements intact.     Conjunctiva/sclera: Conjunctivae normal.  Neck:     Musculoskeletal: Normal range of motion and neck supple.  Cardiovascular:     Rate and Rhythm: Normal rate and regular rhythm.     Heart sounds: Normal heart sounds.  Pulmonary:     Effort: Pulmonary effort is normal.     Breath sounds: Normal breath sounds.  Musculoskeletal: Normal range of motion.  Skin:    General: Skin is warm and dry.  Neurological:     General: No focal deficit present.     Mental Status: She is alert and oriented to person, place, and time.     Motor: No weakness.     Gait: Gait normal.  Psychiatric:        Mood and Affect: Mood normal.        Thought Content: Thought content normal.        Judgment: Judgment normal.     Results for orders placed or performed in visit on 08/13/18  TSH  Result Value Ref Range   TSH 3.160 0.450 - 4.500 uIU/mL      Assessment & Plan:   Problem List Items Addressed This Visit      Endocrine   Hypothyroid  Recheck TSH, adjust as needed      Relevant Orders   TSH (Completed)     Other   Panic disorder - Primary    Increase trintellix to 20 mg and continue rare prn use of ativan. Work on lifestyle modifications to keep stress at a minimum. Counseling recommended, states she does not have time right now      Relevant Medications   vortioxetine HBr (TRINTELLIX) 20 MG TABS tablet    Other Visit Diagnoses    Other speech disturbance       Referral placed to Neurology for further evaluation of her intermittent expressive aphasia. Continue to monitor closely, return precations given   Relevant Orders   Ambulatory referral to Neurology       Follow up plan: Return in about 6 weeks (around 09/24/2018) for anxiety.

## 2018-08-14 LAB — TSH: TSH: 3.16 u[IU]/mL (ref 0.450–4.500)

## 2018-08-16 NOTE — Assessment & Plan Note (Signed)
Recheck TSH, adjust as needed 

## 2018-08-16 NOTE — Assessment & Plan Note (Signed)
Increase trintellix to 20 mg and continue rare prn use of ativan. Work on lifestyle modifications to keep stress at a minimum. Counseling recommended, states she does not have time right now

## 2018-08-17 ENCOUNTER — Encounter: Payer: Self-pay | Admitting: Family Medicine

## 2018-08-18 ENCOUNTER — Encounter: Payer: Self-pay | Admitting: Emergency Medicine

## 2018-08-18 ENCOUNTER — Emergency Department
Admission: EM | Admit: 2018-08-18 | Discharge: 2018-08-18 | Disposition: A | Discharge status: Home / Self Care | Payer: 59 | Attending: Emergency Medicine | Admitting: Emergency Medicine

## 2018-08-18 ENCOUNTER — Other Ambulatory Visit: Payer: Self-pay

## 2018-08-18 DIAGNOSIS — R42 Dizziness and giddiness: Secondary | ICD-10-CM | POA: Diagnosis not present

## 2018-08-18 DIAGNOSIS — Z5321 Procedure and treatment not carried out due to patient leaving prior to being seen by health care provider: Secondary | ICD-10-CM | POA: Insufficient documentation

## 2018-08-18 DIAGNOSIS — R410 Disorientation, unspecified: Secondary | ICD-10-CM | POA: Diagnosis not present

## 2018-08-18 LAB — CBC WITH DIFFERENTIAL/PLATELET
ABS IMMATURE GRANULOCYTES: 0.02 10*3/uL (ref 0.00–0.07)
BASOS PCT: 1 %
Basophils Absolute: 0.1 10*3/uL (ref 0.0–0.1)
Eosinophils Absolute: 0.4 10*3/uL (ref 0.0–0.5)
Eosinophils Relative: 4 %
HCT: 44.2 % (ref 36.0–46.0)
Hemoglobin: 14.8 g/dL (ref 12.0–15.0)
Immature Granulocytes: 0 %
Lymphocytes Relative: 34 %
Lymphs Abs: 3.2 10*3/uL (ref 0.7–4.0)
MCH: 30.6 pg (ref 26.0–34.0)
MCHC: 33.5 g/dL (ref 30.0–36.0)
MCV: 91.3 fL (ref 80.0–100.0)
MONO ABS: 0.8 10*3/uL (ref 0.1–1.0)
Monocytes Relative: 9 %
NEUTROS ABS: 5 10*3/uL (ref 1.7–7.7)
Neutrophils Relative %: 52 %
PLATELETS: 294 10*3/uL (ref 150–400)
RBC: 4.84 MIL/uL (ref 3.87–5.11)
RDW: 13.2 % (ref 11.5–15.5)
WBC: 9.5 10*3/uL (ref 4.0–10.5)
nRBC: 0 % (ref 0.0–0.2)

## 2018-08-18 LAB — URINALYSIS, COMPLETE (UACMP) WITH MICROSCOPIC
BILIRUBIN URINE: NEGATIVE
GLUCOSE, UA: NEGATIVE mg/dL
HGB URINE DIPSTICK: NEGATIVE
Ketones, ur: NEGATIVE mg/dL
LEUKOCYTE UA: NEGATIVE
Nitrite: NEGATIVE
Protein, ur: NEGATIVE mg/dL
Specific Gravity, Urine: 1.003 — ABNORMAL LOW (ref 1.005–1.030)
pH: 6 (ref 5.0–8.0)

## 2018-08-18 LAB — COMPREHENSIVE METABOLIC PANEL
ALT: 10 U/L (ref 0–44)
AST: 12 U/L — ABNORMAL LOW (ref 15–41)
Albumin: 4.2 g/dL (ref 3.5–5.0)
Alkaline Phosphatase: 46 U/L (ref 38–126)
Anion gap: 8 (ref 5–15)
BUN: 9 mg/dL (ref 6–20)
CHLORIDE: 107 mmol/L (ref 98–111)
CO2: 25 mmol/L (ref 22–32)
CREATININE: 0.7 mg/dL (ref 0.44–1.00)
Calcium: 9.4 mg/dL (ref 8.9–10.3)
GFR calc non Af Amer: >60 mL/min (ref >60)
Glucose, Bld: 126 mg/dL — ABNORMAL HIGH (ref 70–99)
POTASSIUM: 4.2 mmol/L (ref 3.5–5.1)
Sodium: 140 mmol/L (ref 135–145)
Total Bilirubin: 0.6 mg/dL (ref 0.3–1.2)
Total Protein: 7.3 g/dL (ref 6.5–8.1)

## 2018-08-18 LAB — TROPONIN I: Troponin I: <0.03 ng/mL (ref <0.03)

## 2018-08-18 LAB — POCT PREGNANCY, URINE: PREG TEST UR: NEGATIVE

## 2018-08-18 NOTE — ED Notes (Signed)
Patient left without making anyone aware.

## 2018-08-18 NOTE — ED Notes (Signed)
First Nurse Note: Recheck of VS - patient not in [place] or [place] at this time.  Will attempt to locate further.

## 2018-08-18 NOTE — Telephone Encounter (Signed)
Called and spoke to patient. She is willing to go to [place], [place], [place], or [place] if she can get into a neurology office sooner. Will send to referral coordinator to see if they can find someone who can see her sooner as well as to provider for any other recommendation.

## 2018-08-18 NOTE — ED Triage Notes (Signed)
Pt ambulatory to triage without difficulty or distress noted; pt reports x yr having confusion & dizziness, "getting lost", worse last 3 days; st has seen PCP but "waiting on neurologist"--no testing was performed

## 2018-08-19 ENCOUNTER — Encounter: Payer: Self-pay | Admitting: Family Medicine

## 2018-08-20 ENCOUNTER — Ambulatory Visit: Payer: 59 | Admitting: Family Medicine

## 2018-08-20 ENCOUNTER — Encounter: Payer: Self-pay | Admitting: Family Medicine

## 2018-08-20 VITALS — BP 121/85 | HR 73 | Temp 97.5°F | Wt 169.3 lb

## 2018-08-20 DIAGNOSIS — R404 Transient alteration of awareness: Secondary | ICD-10-CM | POA: Diagnosis not present

## 2018-08-20 NOTE — Progress Notes (Signed)
BP 121/85   Pulse 73   Temp (!) 97.5 F (36.4 C) (Oral)   Wt 169 lb 4.8 oz (76.8 kg)   SpO2 97%   BMI 29.06 kg/m    Subjective:    [name], female    DOB: April 23, 1976, 43 y.o.   MRN: [id number]  [name] is a 43 y.o. female  Chief Complaint  Patient presents with  . Altered Mental Status    pt states she has still been feeling "lost". States its like she is not aware of her surroundings. Pt states she went to [facility] last night and given a migraine cocktail. States CT scan was negative for bleeds   Here today for ER f/u for altered mental status. Has recently sought care for almost a year of worsening intermittent mental status changes where she states she will "lose time" for short periods and will have confusion and expressive aphasia. Drives over an hour to work daily and does not feel safe going with how significant sxs are currently. Also states she's been messing up at work. Has an appt scheduled with Neurology for consultation next week but hoping to get in somewhere sooner. Workup in ER yesterday negative - CT head and labs benign, dx'd with complicated migraine and did note mild improvement after migraine cocktail. Does not feel her sxs are fully explained by migraines.   Relevant past medical, surgical, family and social history reviewed and updated as indicated. Interim medical history since our last visit reviewed. Allergies and medications reviewed and updated.  Review of Systems  Per HPI unless specifically indicated above     Objective:    BP 121/85   Pulse 73   Temp (!) 97.5 F (36.4 C) (Oral)   Wt 169 lb 4.8 oz (76.8 kg)   SpO2 97%   BMI 29.06 kg/m   Wt Readings from Last 3 Encounters:  08/20/18 169 lb 4.8 oz (76.8 kg)  08/18/18 160 lb (72.6 kg)  08/13/18 164 lb 3.2 oz (74.5 kg)    Physical Exam Vitals signs and nursing note reviewed.  Constitutional:      Appearance: Normal appearance. She is not  ill-appearing.  HENT:     Head: Atraumatic.  Eyes:     Extraocular Movements: Extraocular movements intact.     Conjunctiva/sclera: Conjunctivae normal.  Neck:     Musculoskeletal: Normal range of motion and neck supple.  Cardiovascular:     Rate and Rhythm: Normal rate and regular rhythm.     Heart sounds: Normal heart sounds.  Pulmonary:     Effort: Pulmonary effort is normal.     Breath sounds: Normal breath sounds.  Musculoskeletal: Normal range of motion.  Skin:    General: Skin is warm and dry.  Neurological:     General: No focal deficit present.     Mental Status: She is alert and oriented to person, place, and time.  Psychiatric:        Mood and Affect: Mood normal.        Thought Content: Thought content normal.        Judgment: Judgment normal.     Results for orders placed or performed during the hospital encounter of 08/18/18  CBC with Differential  Result Value Ref Range   WBC 9.5 4.0 - 10.5 K/uL   RBC 4.84 3.87 - 5.11 MIL/uL   Hemoglobin 14.8 12.0 - 15.0 g/dL   HCT 44.2 36.0 - 46.0 %   MCV 91.3  80.0 - 100.0 fL   MCH 30.6 26.0 - 34.0 pg   MCHC 33.5 30.0 - 36.0 g/dL   RDW 13.2 11.5 - 15.5 %   Platelets 294 150 - 400 K/uL   nRBC 0.0 0.0 - 0.2 %   Neutrophils Relative % 52 %   Neutro Abs 5.0 1.7 - 7.7 K/uL   Lymphocytes Relative 34 %   Lymphs Abs 3.2 0.7 - 4.0 K/uL   Monocytes Relative 9 %   Monocytes Absolute 0.8 0.1 - 1.0 K/uL   Eosinophils Relative 4 %   Eosinophils Absolute 0.4 0.0 - 0.5 K/uL   Basophils Relative 1 %   Basophils Absolute 0.1 0.0 - 0.1 K/uL   Immature Granulocytes 0 %   Abs Immature Granulocytes 0.02 0.00 - 0.07 K/uL  Comprehensive metabolic panel  Result Value Ref Range   Sodium 140 135 - 145 mmol/L   Potassium 4.2 3.5 - 5.1 mmol/L   Chloride 107 98 - 111 mmol/L   CO2 25 22 - 32 mmol/L   Glucose, Bld 126 (H) 70 - 99 mg/dL   BUN 9 6 - 20 mg/dL   Creatinine, Ser 0.70 0.44 - 1.00 mg/dL   Calcium 9.4 8.9 - 10.3 mg/dL   Total  Protein 7.3 6.5 - 8.1 g/dL   Albumin 4.2 3.5 - 5.0 g/dL   AST 12 (L) 15 - 41 U/L   ALT 10 0 - 44 U/L   Alkaline Phosphatase 46 38 - 126 U/L   Total Bilirubin 0.6 0.3 - 1.2 mg/dL   GFR calc non Af Amer >60 >60 mL/min   GFR calc Af Amer >60 >60 mL/min   Anion gap 8 5 - 15  Troponin I - ONCE - STAT  Result Value Ref Range   Troponin I <0.03 <0.03 ng/mL  Urinalysis, Complete w Microscopic  Result Value Ref Range   Color, Urine STRAW (A) YELLOW   APPearance CLEAR (A) CLEAR   Specific Gravity, Urine 1.003 (L) 1.005 - 1.030   pH 6.0 5.0 - 8.0   Glucose, UA NEGATIVE NEGATIVE mg/dL   Hgb urine dipstick NEGATIVE NEGATIVE   Bilirubin Urine NEGATIVE NEGATIVE   Ketones, ur NEGATIVE NEGATIVE mg/dL   Protein, ur NEGATIVE NEGATIVE mg/dL   Nitrite NEGATIVE NEGATIVE   Leukocytes,Ua NEGATIVE NEGATIVE   RBC / HPF 0-5 0 - 5 RBC/hpf   WBC, UA 0-5 0 - 5 WBC/hpf   Bacteria, UA RARE (A) NONE SEEN   Squamous Epithelial / LPF 6-10 0 - 5  Pregnancy, urine POC  Result Value Ref Range   Preg Test, Ur NEGATIVE NEGATIVE      Assessment & Plan:   Problem List Items Addressed This Visit    None    Visit Diagnoses    Transient alteration of awareness    -  Primary    ER notes, labs and imaging reviewed. Will work up moving Neurology consultation up and in meantime write her out of work given dangers of commuting with current sxs. Discussed strict return precautions if sxs worsening in meantime  Greater than 25 min spent in direct care and coordination today  Follow up plan: Return for as scheduled.

## 2018-08-25 ENCOUNTER — Encounter: Payer: Self-pay | Admitting: Family Medicine

## 2018-08-30 ENCOUNTER — Telehealth: Payer: Self-pay | Admitting: Family Medicine

## 2018-08-30 NOTE — Telephone Encounter (Signed)
Copied from [place] [id number]. Topic: Quick Communication - See Telephone Encounter >> Aug 30, 2018  9:31 AM Ahmed Prima L wrote: CRM for notification. See Telephone encounter for: 08/30/18.  Joy with [employer] - patient's employer called and said she received some paperwork from the office, it was missing the ICD 10 code. She will need that faxed to her @ [phone] or you can call her anytime @ [phone]

## 2018-08-30 NOTE — Telephone Encounter (Signed)
R40.4

## 2018-08-30 NOTE — Telephone Encounter (Signed)
Relayed information to [place].

## 2018-08-30 NOTE — Telephone Encounter (Signed)
What DX code for paperwork completed on Friday. Please advise.

## 2018-09-23 ENCOUNTER — Telehealth: Payer: Self-pay | Admitting: Family Medicine

## 2018-09-23 NOTE — Telephone Encounter (Signed)
Called pt to schedule virtual she wanted to do telephone only

## 2018-09-23 NOTE — Telephone Encounter (Signed)
Noted  

## 2018-09-24 ENCOUNTER — Ambulatory Visit (INDEPENDENT_AMBULATORY_CARE_PROVIDER_SITE_OTHER): Payer: 59 | Admitting: Family Medicine

## 2018-09-24 ENCOUNTER — Other Ambulatory Visit: Payer: Self-pay

## 2018-09-24 DIAGNOSIS — E538 Deficiency of other specified B group vitamins: Secondary | ICD-10-CM | POA: Diagnosis not present

## 2018-09-24 DIAGNOSIS — E039 Hypothyroidism, unspecified: Secondary | ICD-10-CM | POA: Diagnosis not present

## 2018-09-24 DIAGNOSIS — E063 Autoimmune thyroiditis: Secondary | ICD-10-CM | POA: Insufficient documentation

## 2018-09-24 DIAGNOSIS — E559 Vitamin D deficiency, unspecified: Secondary | ICD-10-CM | POA: Diagnosis not present

## 2018-09-24 DIAGNOSIS — F410 Panic disorder [episodic paroxysmal anxiety] without agoraphobia: Secondary | ICD-10-CM | POA: Diagnosis not present

## 2018-09-24 DIAGNOSIS — F17200 Nicotine dependence, unspecified, uncomplicated: Secondary | ICD-10-CM | POA: Insufficient documentation

## 2018-09-24 DIAGNOSIS — N838 Other noninflammatory disorders of ovary, fallopian tube and broad ligament: Secondary | ICD-10-CM | POA: Insufficient documentation

## 2018-09-24 NOTE — Progress Notes (Signed)
There were no vitals taken for this visit.   Subjective:    [name], female    DOB: July 30, 1975, 43 y.o.   MRN: [id number]  [name] is a 43 y.o. female  Chief Complaint  Patient presents with  . Follow-up  . Anxiety    Same.   . Low B12    . This visit was completed via telephone due to the restrictions of the COVID-19 pandemic. All issues as above were discussed and addressed but no physical exam was performed. If it was felt that the patient should be evaluated in the office, they were directed there. The patient verbally consented to this visit. Patient was unable to complete an audio/visual visit due to Technical difficulties,Lack of internet. Due to the catastrophic nature of the COVID-19 pandemic, this visit was done through audio contact only. . Location of the patient: home . Location of the provider: home . Those involved with this call:  . Provider: Merrie Roof, PA-C . CMA: Yvonna Alanis, [place] . Front Desk/Registration: Jill Side  . Time spent on call: 25 minutes on the phone discussing health concerns. 10 minutes total spent in review of patient's record and preparation of their chart.  Following up today for 3 month anxiety f/u. Trintellix still working very well, having to use the ativan about 2 times weekly on average, sometimes more particularly lately as she's been dealing with her own health issues, COVID 19, and her daughter's recent car accident. Feels things are under good control given the circumstances. Denies SI/HI, mood swings.   Has started evaluation with Neurology for her altered mental status, eval was interrupted by COVID 19 for now but will soon have EEG and sleep study. Vit D and B12 low during lab workup, was to start B12 injections through Neurology but after first one the clinic was closed due to the virus. Taking 2000 IU vit D daily currently OTC.   Past Medical History:  Diagnosis Date  . Abdominal pain    . Acute foot pain   . Anxiety   . Asthma   . COPD (chronic obstructive pulmonary disease) ([place])   . Cough   . Cyst, dermoid, face   . Dizziness   . Fatigue   . Goiter   . Hashimoto's disease   . Hypothyroidism   . Microscopic hematuria   . Nevus   . Pain and swelling of right knee   . Shortness of breath at rest   . Thyroid disease    hypothyroid  . Urinary frequency   . Vitamin D deficiency    Social History   Socioeconomic History  . Marital status: Married    Spouse name: Not on file  . Number of children: Not on file  . Years of education: Not on file  . Highest education level: Not on file  Occupational History  . Not on file  Social Needs  . Financial resource strain: Not on file  . Food insecurity:    Worry: Not on file    Inability: Not on file  . Transportation needs:    Medical: Not on file    Non-medical: Not on file  Tobacco Use  . Smoking status: Current Every Day Smoker    Packs/day: 0.50    Types: Cigarettes  . Smokeless tobacco: Never Used  Substance and Sexual Activity  . Alcohol use: Yes    Comment: on rare occasion- maybe twice a year   . Drug use: No  .  Sexual activity: Yes    Birth control/protection: I.U.D.  Lifestyle  . Physical activity:    Days per week: Not on file    Minutes per session: Not on file  . Stress: Not on file  Relationships  . Social connections:    Talks on phone: Not on file    Gets together: Not on file    Attends religious service: Not on file    Active member of club or organization: Not on file    Attends meetings of clubs or organizations: Not on file    Relationship status: Not on file  . Intimate partner violence:    Fear of current or ex partner: Not on file    Emotionally abused: Not on file    Physically abused: Not on file    Forced sexual activity: Not on file  Other Topics Concern  . Not on file  Social History Narrative   ** Merged History Encounter **       Depression screen [facility] 2/9  09/24/2018 08/13/2018 07/16/2018  Decreased Interest 2 1 1   Down, Depressed, Hopeless 0 0 0  PHQ - 2 Score 2 1 1   Altered sleeping 0 3 3  Tired, decreased energy 3 3 1   Change in appetite 0 1 1  Feeling bad or failure about yourself  0 0 0  Trouble concentrating 2 3 1   Moving slowly or fidgety/restless 0 0 2  Suicidal thoughts 0 0 0  PHQ-9 Score 7 11 9   Difficult doing work/chores Not difficult at all - -   GAD 7 : Generalized Anxiety Score 09/24/2018 08/13/2018 07/16/2018 03/26/2018  Nervous, Anxious, on Edge 3 1 2 1   Control/stop worrying 3 1 2  0  Worry too much - different things 3 1 2  0  Trouble relaxing 3 1 2  0  Restless 0 0 1 1  Easily annoyed or irritable 0 1 2 0  Afraid - awful might happen 3 2 2 2   Total GAD 7 Score 15 7 13 4   Anxiety Difficulty Not difficult at all - Somewhat difficult -   Relevant past medical, surgical, family and social history reviewed and updated as indicated. Interim medical history since our last visit reviewed. Allergies and medications reviewed and updated.  Review of Systems  Per HPI unless specifically indicated above     Objective:    There were no vitals taken for this visit.  Wt Readings from Last 3 Encounters:  08/20/18 169 lb 4.8 oz (76.8 kg)  08/18/18 160 lb (72.6 kg)  08/13/18 164 lb 3.2 oz (74.5 kg)    Physical Exam  Results for orders placed or performed during the hospital encounter of 08/18/18  CBC with Differential  Result Value Ref Range   WBC 9.5 4.0 - 10.5 K/uL   RBC 4.84 3.87 - 5.11 MIL/uL   Hemoglobin 14.8 12.0 - 15.0 g/dL   HCT 44.2 36.0 - 46.0 %   MCV 91.3 80.0 - 100.0 fL   MCH 30.6 26.0 - 34.0 pg   MCHC 33.5 30.0 - 36.0 g/dL   RDW 13.2 11.5 - 15.5 %   Platelets 294 150 - 400 K/uL   nRBC 0.0 0.0 - 0.2 %   Neutrophils Relative % 52 %   Neutro Abs 5.0 1.7 - 7.7 K/uL   Lymphocytes Relative 34 %   Lymphs Abs 3.2 0.7 - 4.0 K/uL   Monocytes Relative 9 %   Monocytes Absolute 0.8 0.1 - 1.0 K/uL   Eosinophils  Relative 4 %   Eosinophils Absolute 0.4 0.0 - 0.5 K/uL   Basophils Relative 1 %   Basophils Absolute 0.1 0.0 - 0.1 K/uL   Immature Granulocytes 0 %   Abs Immature Granulocytes 0.02 0.00 - 0.07 K/uL  Comprehensive metabolic panel  Result Value Ref Range   Sodium 140 135 - 145 mmol/L   Potassium 4.2 3.5 - 5.1 mmol/L   Chloride 107 98 - 111 mmol/L   CO2 25 22 - 32 mmol/L   Glucose, Bld 126 (H) 70 - 99 mg/dL   BUN 9 6 - 20 mg/dL   Creatinine, Ser 0.70 0.44 - 1.00 mg/dL   Calcium 9.4 8.9 - 10.3 mg/dL   Total Protein 7.3 6.5 - 8.1 g/dL   Albumin 4.2 3.5 - 5.0 g/dL   AST 12 (L) 15 - 41 U/L   ALT 10 0 - 44 U/L   Alkaline Phosphatase 46 38 - 126 U/L   Total Bilirubin 0.6 0.3 - 1.2 mg/dL   GFR calc non Af Amer >60 >60 mL/min   GFR calc Af Amer >60 >60 mL/min   Anion gap 8 5 - 15  Troponin I - ONCE - STAT  Result Value Ref Range   Troponin I <0.03 <0.03 ng/mL  Urinalysis, Complete w Microscopic  Result Value Ref Range   Color, Urine STRAW (A) YELLOW   APPearance CLEAR (A) CLEAR   Specific Gravity, Urine 1.003 (L) 1.005 - 1.030   pH 6.0 5.0 - 8.0   Glucose, UA NEGATIVE NEGATIVE mg/dL   Hgb urine dipstick NEGATIVE NEGATIVE   Bilirubin Urine NEGATIVE NEGATIVE   Ketones, ur NEGATIVE NEGATIVE mg/dL   Protein, ur NEGATIVE NEGATIVE mg/dL   Nitrite NEGATIVE NEGATIVE   Leukocytes,Ua NEGATIVE NEGATIVE   RBC / HPF 0-5 0 - 5 RBC/hpf   WBC, UA 0-5 0 - 5 WBC/hpf   Bacteria, UA RARE (A) NONE SEEN   Squamous Epithelial / LPF 6-10 0 - 5  Pregnancy, urine POC  Result Value Ref Range   Preg Test, Ur NEGATIVE NEGATIVE      Assessment & Plan:   Problem List Items Addressed This Visit      Endocrine   Hypothyroidism    Labs abnormal at Neurology visit, pt states she had not taken her medicine for over a week. Will recheck in 3 weeks now that she's back on her synthroid      Relevant Medications   levothyroxine (SYNTHROID) 150 MCG tablet   Other Relevant Orders   TSH     Other    Vitamin D deficiency    Continue OTC supplements, will check vit levels next month and adjust if needed      Relevant Orders   Vitamin D (25 hydroxy)   Panic disorder - Primary    Improved with trintellix, prn ativan. Continue current regimen with rare use of ativan. 1 script should last around 3 months      Relevant Medications   LORazepam (ATIVAN) 0.5 MG tablet   B12 deficiency    Will send supplies for home supplementation once monthly and recheck levels in 3 weeks      Relevant Orders   Vitamin B12       Follow up plan: Return in about 3 months (around 12/24/2018) for anxiety f/u.

## 2018-09-27 DIAGNOSIS — E538 Deficiency of other specified B group vitamins: Secondary | ICD-10-CM | POA: Insufficient documentation

## 2018-09-27 MED ORDER — "INSULIN SYRINGE 31G X 5/16"" 1 ML MISC": 1.0000 [IU] | 0 refills | Status: DC

## 2018-09-27 MED ORDER — SYRINGE 22G X 1-1/2" 3 ML MISC
1.0000 [IU] | 1 refills | Status: AC
Start: 2018-09-27 — End: ?

## 2018-09-27 MED ORDER — LORAZEPAM 0.5 MG PO TABS: 0.5000 mg | ORAL_TABLET | ORAL | 0 refills | Status: DC | PRN

## 2018-09-27 MED ORDER — LEVOTHYROXINE SODIUM 150 MCG PO TABS: 150.0000 ug | ORAL_TABLET | Freq: Every day | ORAL | 1 refills | Status: DC

## 2018-09-27 MED ORDER — CYANOCOBALAMIN 1000 MCG/ML IJ SOLN: 1000.0000 ug | INTRAMUSCULAR | 0 refills | Status: AC

## 2018-09-27 NOTE — Assessment & Plan Note (Signed)
Improved with trintellix, prn ativan. Continue current regimen with rare use of ativan. 1 script should last around 3 months

## 2018-09-27 NOTE — Assessment & Plan Note (Signed)
Will send supplies for home supplementation once monthly and recheck levels in 3 weeks

## 2018-09-27 NOTE — Assessment & Plan Note (Signed)
Continue OTC supplements, will check vit levels next month and adjust if needed

## 2018-09-27 NOTE — Assessment & Plan Note (Signed)
Labs abnormal at Neurology visit, pt states she had not taken her medicine for over a week. Will recheck in 3 weeks now that she's back on her synthroid

## 2018-09-28 ENCOUNTER — Telehealth: Payer: Self-pay

## 2018-09-28 NOTE — Telephone Encounter (Signed)
Pharmacy sent a fax questioning the directions on the patient's lorazepam RX. They request that it has "frequency of how patient will be using the medication and days supply limitations." Please advise and resend to the pharmacy.

## 2018-09-28 NOTE — Telephone Encounter (Signed)
Kaitlyn from [employer] called regarding "frequency" of Lorazepam. Please advise.    [id number] [address], [place] AT [facility] OF [address]  [place] [address]  Phone: [phone] Fax: [phone]  Not a 24 hour pharmacy; exact hours not known

## 2018-09-29 MED ORDER — LORAZEPAM 0.5 MG PO TABS: 0.5000 mg | ORAL_TABLET | Freq: Every day | ORAL | 0 refills | Status: DC | PRN

## 2018-09-29 NOTE — Telephone Encounter (Signed)
Re-sent with new instructions to take daily prn. Please cancel previous script

## 2018-10-01 ENCOUNTER — Encounter: Payer: 59 | Admitting: Family Medicine

## 2018-10-22 ENCOUNTER — Other Ambulatory Visit: Payer: Self-pay | Admitting: Family Medicine

## 2018-10-25 ENCOUNTER — Other Ambulatory Visit: Payer: Self-pay | Admitting: Family Medicine

## 2018-10-26 ENCOUNTER — Encounter: Payer: Self-pay | Admitting: Family Medicine

## 2019-01-07 ENCOUNTER — Encounter: Payer: 59 | Admitting: Family Medicine

## 2019-01-18 ENCOUNTER — Encounter: Payer: Self-pay | Admitting: Family Medicine

## 2019-01-18 ENCOUNTER — Ambulatory Visit (INDEPENDENT_AMBULATORY_CARE_PROVIDER_SITE_OTHER): Payer: 59 | Admitting: Family Medicine

## 2019-01-18 ENCOUNTER — Other Ambulatory Visit: Payer: Self-pay

## 2019-01-18 VITALS — Ht 64.0 in | Wt 160.0 lb

## 2019-01-18 DIAGNOSIS — M778 Other enthesopathies, not elsewhere classified: Secondary | ICD-10-CM | POA: Diagnosis not present

## 2019-01-18 MED ORDER — PREDNISONE 10 MG PO TABS: ORAL_TABLET | ORAL | 0 refills | Status: DC

## 2019-01-18 NOTE — Progress Notes (Signed)
Ht 5\' 4"  (1.626 m)   Wt 160 lb (72.6 kg)   BMI 27.46 kg/m    Subjective:    [name], female    DOB: 1976-01-19, 43 y.o.   MRN: [id number]  [name] is a 43 y.o. female  Chief Complaint  Patient presents with  . Elbow Pain    Ongoing pain in elbow for about 1 month. Radiating down to hand and fingers. Unable to grip things, or lift. Having trouble straightening arm. Right elbow/arm/hand.    . This visit was completed via WebEx due to the restrictions of the COVID-19 pandemic. All issues as above were discussed and addressed. Physical exam was done as above through visual confirmation on WebEx. If it was felt that the patient should be evaluated in the office, they were directed there. The patient verbally consented to this visit. . Location of the patient: home . Location of the provider: home . Those involved with this call:  . Provider: Merrie Roof, PA-C . CMA: Merilyn Baba, [place] . Front Desk/Registration: Jill Side  . Time spent on call: 15 minutes with patient face to face via video conference. More than 50% of this time was spent in counseling and coordination of care. 5 minutes total spent in review of patient's record and preparation of their chart. I verified patient identity using two factors (patient name and date of birth). Patient consents verbally to being seen via telemedicine visit today.   Right lateral elbow pain for about a month now that radiates down into hand at times. Very painful to use for any sort of lifting or twisting motion. Denies swelling, redness, known injury, fevers. Has tried NSAIDs and occasionally a wrist brace with mild relief.   Relevant past medical, surgical, family and social history reviewed and updated as indicated. Interim medical history since our last visit reviewed. Allergies and medications reviewed and updated.  Review of Systems  Per HPI unless specifically indicated above     Objective:     Ht 5\' 4"  (1.626 m)   Wt 160 lb (72.6 kg)   BMI 27.46 kg/m   Wt Readings from Last 3 Encounters:  01/18/19 160 lb (72.6 kg)  08/20/18 169 lb 4.8 oz (76.8 kg)  08/18/18 160 lb (72.6 kg)    Physical Exam Vitals signs and nursing note reviewed.  Constitutional:      General: She is not in acute distress.    Appearance: Normal appearance.  HENT:     Head: Atraumatic.     Right Ear: External ear normal.     Left Ear: External ear normal.     Nose: Nose normal. No congestion.     Mouth/Throat:     Mouth: Mucous membranes are moist.     Pharynx: Oropharynx is clear. No posterior oropharyngeal erythema.  Eyes:     Extraocular Movements: Extraocular movements intact.     Conjunctiva/sclera: Conjunctivae normal.  Neck:     Musculoskeletal: Normal range of motion.  Cardiovascular:     Comments: Unable to assess via virtual visit Pulmonary:     Effort: Pulmonary effort is normal. No respiratory distress.  Musculoskeletal: Normal range of motion.        General: Tenderness (right lateral elbow ttp per patient's self exam) present.  Skin:    General: Skin is dry.     Findings: No erythema.  Neurological:     Mental Status: She is alert and oriented to person, place, and time.  Psychiatric:  Mood and Affect: Mood normal.        Thought Content: Thought content normal.        Judgment: Judgment normal.     Results for orders placed or performed during the hospital encounter of 08/18/18  CBC with Differential  Result Value Ref Range   WBC 9.5 4.0 - 10.5 K/uL   RBC 4.84 3.87 - 5.11 MIL/uL   Hemoglobin 14.8 12.0 - 15.0 g/dL   HCT 44.2 36.0 - 46.0 %   MCV 91.3 80.0 - 100.0 fL   MCH 30.6 26.0 - 34.0 pg   MCHC 33.5 30.0 - 36.0 g/dL   RDW 13.2 11.5 - 15.5 %   Platelets 294 150 - 400 K/uL   nRBC 0.0 0.0 - 0.2 %   Neutrophils Relative % 52 %   Neutro Abs 5.0 1.7 - 7.7 K/uL   Lymphocytes Relative 34 %   Lymphs Abs 3.2 0.7 - 4.0 K/uL   Monocytes Relative 9 %   Monocytes  Absolute 0.8 0.1 - 1.0 K/uL   Eosinophils Relative 4 %   Eosinophils Absolute 0.4 0.0 - 0.5 K/uL   Basophils Relative 1 %   Basophils Absolute 0.1 0.0 - 0.1 K/uL   Immature Granulocytes 0 %   Abs Immature Granulocytes 0.02 0.00 - 0.07 K/uL  Comprehensive metabolic panel  Result Value Ref Range   Sodium 140 135 - 145 mmol/L   Potassium 4.2 3.5 - 5.1 mmol/L   Chloride 107 98 - 111 mmol/L   CO2 25 22 - 32 mmol/L   Glucose, Bld 126 (H) 70 - 99 mg/dL   BUN 9 6 - 20 mg/dL   Creatinine, Ser 0.70 0.44 - 1.00 mg/dL   Calcium 9.4 8.9 - 10.3 mg/dL   Total Protein 7.3 6.5 - 8.1 g/dL   Albumin 4.2 3.5 - 5.0 g/dL   AST 12 (L) 15 - 41 U/L   ALT 10 0 - 44 U/L   Alkaline Phosphatase 46 38 - 126 U/L   Total Bilirubin 0.6 0.3 - 1.2 mg/dL   GFR calc non Af Amer >60 >60 mL/min   GFR calc Af Amer >60 >60 mL/min   Anion gap 8 5 - 15  Troponin I - ONCE - STAT  Result Value Ref Range   Troponin I <0.03 <0.03 ng/mL  Urinalysis, Complete w Microscopic  Result Value Ref Range   Color, Urine STRAW (A) YELLOW   APPearance CLEAR (A) CLEAR   Specific Gravity, Urine 1.003 (L) 1.005 - 1.030   pH 6.0 5.0 - 8.0   Glucose, UA NEGATIVE NEGATIVE mg/dL   Hgb urine dipstick NEGATIVE NEGATIVE   Bilirubin Urine NEGATIVE NEGATIVE   Ketones, ur NEGATIVE NEGATIVE mg/dL   Protein, ur NEGATIVE NEGATIVE mg/dL   Nitrite NEGATIVE NEGATIVE   Leukocytes,Ua NEGATIVE NEGATIVE   RBC / HPF 0-5 0 - 5 RBC/hpf   WBC, UA 0-5 0 - 5 WBC/hpf   Bacteria, UA RARE (A) NONE SEEN   Squamous Epithelial / LPF 6-10 0 - 5  Pregnancy, urine POC  Result Value Ref Range   Preg Test, Ur NEGATIVE NEGATIVE      Assessment & Plan:   Problem List Items Addressed This Visit    None    Visit Diagnoses    Right elbow tendonitis    -  Primary   Tx with prednisone, epsom salt soaks, heat, rest, compression brace. F/u if worsening or not improving       Follow up plan:  Return if symptoms worsen or fail to improve.

## 2019-02-18 ENCOUNTER — Encounter: Payer: Self-pay | Admitting: Family Medicine

## 2019-02-18 ENCOUNTER — Other Ambulatory Visit: Payer: Self-pay | Admitting: Family Medicine

## 2019-02-18 ENCOUNTER — Other Ambulatory Visit: Payer: Self-pay

## 2019-02-18 ENCOUNTER — Ambulatory Visit (INDEPENDENT_AMBULATORY_CARE_PROVIDER_SITE_OTHER): Payer: BC Managed Care – PPO | Admitting: Family Medicine

## 2019-02-18 VITALS — BP 119/78 | HR 93 | Temp 98.9°F | Ht 63.0 in | Wt 160.0 lb

## 2019-02-18 DIAGNOSIS — E039 Hypothyroidism, unspecified: Secondary | ICD-10-CM

## 2019-02-18 DIAGNOSIS — Z1239 Encounter for other screening for malignant neoplasm of breast: Secondary | ICD-10-CM

## 2019-02-18 DIAGNOSIS — Z0000 Encounter for general adult medical examination without abnormal findings: Secondary | ICD-10-CM | POA: Diagnosis not present

## 2019-02-18 DIAGNOSIS — Z720 Tobacco use: Secondary | ICD-10-CM

## 2019-02-18 DIAGNOSIS — J449 Chronic obstructive pulmonary disease, unspecified: Secondary | ICD-10-CM | POA: Diagnosis not present

## 2019-02-18 DIAGNOSIS — Z23 Encounter for immunization: Secondary | ICD-10-CM

## 2019-02-18 DIAGNOSIS — F325 Major depressive disorder, single episode, in full remission: Secondary | ICD-10-CM

## 2019-02-18 DIAGNOSIS — Z809 Family history of malignant neoplasm, unspecified: Secondary | ICD-10-CM

## 2019-02-18 LAB — UA/M W/RFLX CULTURE, ROUTINE
Bilirubin, UA: NEGATIVE
Glucose, UA: NEGATIVE
Ketones, UA: NEGATIVE
Leukocytes,UA: NEGATIVE
Nitrite, UA: NEGATIVE
Protein,UA: NEGATIVE
RBC, UA: NEGATIVE
Specific Gravity, UA: 1.015 (ref 1.005–1.030)
Urobilinogen, Ur: 1 mg/dL (ref 0.2–1.0)
pH, UA: 5.5 (ref 5.0–7.5)

## 2019-02-18 MED ORDER — LORAZEPAM 0.5 MG PO TABS: 0.5000 mg | ORAL_TABLET | Freq: Every day | ORAL | 0 refills | Status: DC | PRN

## 2019-02-18 MED ORDER — VORTIOXETINE HBR 10 MG PO TABS: 10.0000 mg | ORAL_TABLET | Freq: Every day | ORAL | 1 refills | Status: DC

## 2019-02-18 NOTE — Telephone Encounter (Signed)
Patient has appointment today

## 2019-02-18 NOTE — Progress Notes (Signed)
BP 119/78   Pulse 93   Temp 98.9 F (37.2 C) (Oral)   Ht 5\' 3"  (1.6 m)   Wt 160 lb (72.6 kg)   SpO2 100%   BMI 28.34 kg/m    Subjective:    [name], female    DOB: September 11, 1975, 43 y.o.   MRN: [id number]  [name] is a 43 y.o. female presenting on 02/18/2019 for comprehensive medical examination. Current medical complaints include:see below  Anxiety and depression - Stopped taking the trintellix because the higher dose made her significantly nauseated. Did much better on the 10 mg dose. Taking ativan about 1 time weekly right now. Going through a lot of stress as she was recently let go from her job of many years and is searching for another opportunity. Denies SI/HI.   Maternal cousin and mother both recently died of gastric cancer. Patient very concerned and wanting to discuss if any genetic testing is appropriate for her.   Hypothyroidism - Stable on synthroid, no new sxs  COPD - stable on anoro and albuterol prn without recent flares. Still smoking, the stress makes it impossible for her to consider quitting.   She currently lives with: Menopausal Symptoms: no  Depression Screen done today and results listed below:  Depression screen [facility] 2/9 02/18/2019 09/24/2018 08/13/2018 07/16/2018 03/26/2018  Decreased Interest 1 2 1 1  0  Down, Depressed, Hopeless 1 0 0 0 0  PHQ - 2 Score 2 2 1 1  0  Altered sleeping 2 0 3 3 1   Tired, decreased energy 2 3 3 1 1   Change in appetite 0 0 1 1 1   Feeling bad or failure about yourself  0 0 0 0 0  Trouble concentrating 1 2 3 1  0  Moving slowly or fidgety/restless 0 0 0 2 0  Suicidal thoughts 0 0 0 0 0  PHQ-9 Score 7 7 11 9 3   Difficult doing work/chores - Not difficult at all - - -  Some recent data might be hidden    The patient does not have a history of falls. I did complete a risk assessment for falls. A plan of care for falls was documented.   Past Medical History:  Past Medical History:  Diagnosis Date   . Abdominal pain   . Acute foot pain   . Anxiety   . Asthma   . COPD (chronic obstructive pulmonary disease) ([place])   . Cough   . Cyst, dermoid, face   . Dizziness   . Fatigue   . Goiter   . Hashimoto's disease   . Hypothyroidism   . Microscopic hematuria   . Nevus   . Pain and swelling of right knee   . Shortness of breath at rest   . Thyroid disease    hypothyroid  . Urinary frequency   . Vitamin D deficiency     Surgical History:  Past Surgical History:  Procedure Laterality Date  . CESAREAN SECTION    . CHOLECYSTECTOMY    . CHOLECYSTECTOMY     1995  . OOPHORECTOMY    . OOPHORECTOMY Right 2017    Medications:  Current Outpatient Medications on File Prior to Visit  Medication Sig  . albuterol (VENTOLIN HFA) 108 (90 Base) MCG/ACT inhaler Inhale 2 puffs into the lungs every 6 (six) hours as needed for wheezing or shortness of breath.  . Cholecalciferol (VITAMIN D) 2000 units CAPS Take 2,000 Units by mouth daily.  . cyanocobalamin (,VITAMIN B-12,) 1000  MCG/ML injection Inject 1 mL (1,000 mcg total) into the muscle every 30 (thirty) days for 6 doses.  Marland Kitchen gabapentin (NEURONTIN) 300 MG capsule Take 1 capsule (300 mg total) by mouth 3 (three) times daily.  Marland Kitchen levonorgestrel (MIRENA) 20 MCG/24HR IUD 1 each by Intrauterine route once.  . ondansetron (ZOFRAN ODT) 4 MG disintegrating tablet Take 1 tablet (4 mg total) by mouth every 8 (eight) hours as needed.  . Syringe/Needle, Disp, (SYRINGE 3CC/22GX1-1/2") 22G X 1-1/2" 3 ML MISC Inject 1 Units into the muscle every 30 (thirty) days. Use to inject B12 solution into upper thigh once monthly. Alternate sides each time  . umeclidinium-vilanterol (ANORO ELLIPTA) 62.5-25 MCG/INH AEPB Inhale 1 puff into the lungs daily. Name brand only   No current facility-administered medications on file prior to visit.     Allergies:  Allergies  Allergen Reactions  . Bee Venom Anaphylaxis  . Chantix [Varenicline] Nausea Only  . Flagyl  [Metronidazole] Hives  . Levaquin [Levofloxacin In D5w] Other (See Comments)    arthralgia  . Penicillins Hives    Social History:  Social History   Socioeconomic History  . Marital status: Married    Spouse name: Not on file  . Number of children: Not on file  . Years of education: Not on file  . Highest education level: Not on file  Occupational History  . Not on file  Social Needs  . Financial resource strain: Not on file  . Food insecurity    Worry: Not on file    Inability: Not on file  . Transportation needs    Medical: Not on file    Non-medical: Not on file  Tobacco Use  . Smoking status: Current Every Day Smoker    Packs/day: 0.50    Types: Cigarettes  . Smokeless tobacco: Never Used  Substance and Sexual Activity  . Alcohol use: Yes    Comment: on rare occasion- maybe twice a year   . Drug use: No  . Sexual activity: Yes    Birth control/protection: I.U.D.  Lifestyle  . Physical activity    Days per week: Not on file    Minutes per session: Not on file  . Stress: Not on file  Relationships  . Social Herbalist on phone: Not on file    Gets together: Not on file    Attends religious service: Not on file    Active member of club or organization: Not on file    Attends meetings of clubs or organizations: Not on file    Relationship status: Not on file  . Intimate partner violence    Fear of current or ex partner: Not on file    Emotionally abused: Not on file    Physically abused: Not on file    Forced sexual activity: Not on file  Other Topics Concern  . Not on file  Social History Narrative   ** Merged History Encounter **       Social History   Tobacco Use  Smoking Status Current Every Day Smoker  . Packs/day: 0.50  . Types: Cigarettes  Smokeless Tobacco Never Used   Social History   Substance and Sexual Activity  Alcohol Use Yes   Comment: on rare occasion- maybe twice a year     Family History:  Family History  Problem  Relation Age of Onset  . COPD Mother   . Lymphoma Mother   . Hyperlipidemia Mother   . Thyroid disease Mother   .  COPD Father   . Cancer Father        blood  . Thyroid disease Sister   . Emphysema Paternal Grandfather   . Epilepsy Daughter     Past medical history, surgical history, medications, allergies, family history and social history reviewed with patient today and changes made to appropriate areas of the chart.   Review of Systems - General ROS: negative Psychological ROS: positive for - anxiety and depression Ophthalmic ROS: negative ENT ROS: negative Allergy and Immunology ROS: negative Hematological and Lymphatic ROS: negative Endocrine ROS: negative Breast ROS: negative for breast lumps Respiratory ROS: no cough, shortness of breath, or wheezing Cardiovascular ROS: no chest pain or dyspnea on exertion Gastrointestinal ROS: no abdominal pain, change in bowel habits, or black or bloody stools Genito-Urinary ROS: no dysuria, trouble voiding, or hematuria Musculoskeletal ROS: negative Neurological ROS: no TIA or stroke symptoms Dermatological ROS: negative All other ROS negative except what is listed above and in the HPI.      Objective:    BP 119/78   Pulse 93   Temp 98.9 F (37.2 C) (Oral)   Ht 5\' 3"  (1.6 m)   Wt 160 lb (72.6 kg)   SpO2 100%   BMI 28.34 kg/m   Wt Readings from Last 3 Encounters:  02/18/19 160 lb (72.6 kg)  01/18/19 160 lb (72.6 kg)  08/20/18 169 lb 4.8 oz (76.8 kg)    Physical Exam Vitals signs and nursing note reviewed.  Constitutional:      General: She is not in acute distress.    Appearance: She is well-developed.  HENT:     Head: Atraumatic.     Right Ear: External ear normal.     Left Ear: External ear normal.     Nose: Nose normal.     Mouth/Throat:     Pharynx: No oropharyngeal exudate.  Eyes:     General: No scleral icterus.    Conjunctiva/sclera: Conjunctivae normal.     Pupils: Pupils are equal, round, and reactive  to light.  Neck:     Musculoskeletal: Normal range of motion and neck supple.     Thyroid: No thyromegaly.  Cardiovascular:     Rate and Rhythm: Normal rate and regular rhythm.     Heart sounds: Normal heart sounds.  Pulmonary:     Effort: Pulmonary effort is normal. No respiratory distress.     Breath sounds: Normal breath sounds.  Chest:     Breasts:        Right: No mass, skin change or tenderness.        Left: No mass, skin change or tenderness.  Abdominal:     General: Bowel sounds are normal.     Palpations: Abdomen is soft. There is no mass.     Tenderness: There is no abdominal tenderness.  Musculoskeletal: Normal range of motion.        General: No tenderness.  Lymphadenopathy:     Cervical: No cervical adenopathy.     Upper Body:     Right upper body: No axillary adenopathy.     Left upper body: No axillary adenopathy.  Skin:    General: Skin is warm and dry.     Findings: No rash.  Neurological:     Mental Status: She is alert and oriented to person, place, and time.     Cranial Nerves: No cranial nerve deficit.  Psychiatric:        Behavior: Behavior normal.     Results  for orders placed or performed in visit on 02/18/19  CBC with Differential/Platelet  Result Value Ref Range   WBC 12.2 (H) 3.4 - 10.8 x10E3/uL   RBC 4.42 3.77 - 5.28 x10E6/uL   Hemoglobin 14.1 11.1 - 15.9 g/dL   Hematocrit 41.4 34.0 - 46.6 %   MCV 94 79 - 97 fL   MCH 31.9 26.6 - 33.0 pg   MCHC 34.1 31.5 - 35.7 g/dL   RDW 13.1 11.7 - 15.4 %   Platelets 313 150 - 450 x10E3/uL   Neutrophils 70 Not Estab. %   Lymphs 20 Not Estab. %   Monocytes 7 Not Estab. %   Eos 2 Not Estab. %   Basos 1 Not Estab. %   Neutrophils Absolute 8.6 (H) 1.4 - 7.0 x10E3/uL   Lymphocytes Absolute 2.4 0.7 - 3.1 x10E3/uL   Monocytes Absolute 0.9 0.1 - 0.9 x10E3/uL   EOS (ABSOLUTE) 0.2 0.0 - 0.4 x10E3/uL   Basophils Absolute 0.1 0.0 - 0.2 x10E3/uL   Immature Granulocytes 0 Not Estab. %   Immature Grans (Abs)  0.0 0.0 - 0.1 x10E3/uL  Comprehensive metabolic panel  Result Value Ref Range   Glucose 95 65 - 99 mg/dL   BUN 7 6 - 24 mg/dL   Creatinine, Ser 0.83 0.57 - 1.00 mg/dL   GFR calc non Af Amer 85 >59 mL/min/1.73   GFR calc Af Amer 98 >59 mL/min/1.73   BUN/Creatinine Ratio 8 (L) 9 - 23   Sodium 142 134 - 144 mmol/L   Potassium 4.1 3.5 - 5.2 mmol/L   Chloride 104 96 - 106 mmol/L   CO2 25 20 - 29 mmol/L   Calcium 9.6 8.7 - 10.2 mg/dL   Total Protein 6.7 6.0 - 8.5 g/dL   Albumin 4.5 3.8 - 4.8 g/dL   Globulin, Total 2.2 1.5 - 4.5 g/dL   Albumin/Globulin Ratio 2.0 1.2 - 2.2   Bilirubin Total 0.5 0.0 - 1.2 mg/dL   Alkaline Phosphatase 50 39 - 117 IU/L   AST 11 0 - 40 IU/L   ALT 8 0 - 32 IU/L  Lipid Panel w/o Chol/HDL Ratio  Result Value Ref Range   Cholesterol, Total 189 100 - 199 mg/dL   Triglycerides 78 0 - 149 mg/dL   HDL 53 >39 mg/dL   VLDL Cholesterol Cal 14 5 - 40 mg/dL   LDL Chol Calc (NIH) 122 (H) 0 - 99 mg/dL  TSH  Result Value Ref Range   TSH 6.950 (H) 0.450 - 4.500 uIU/mL  UA/M w/rflx Culture, Routine   Specimen: Urine   URINE  Result Value Ref Range   Specific Gravity, UA 1.015 1.005 - 1.030   pH, UA 5.5 5.0 - 7.5   Color, UA Yellow Yellow   Appearance Ur Clear Clear   Leukocytes,UA Negative Negative   Protein,UA Negative Negative/Trace   Glucose, UA Negative Negative   Ketones, UA Negative Negative   RBC, UA Negative Negative   Bilirubin, UA Negative Negative   Urobilinogen, Ur 1.0 0.2 - 1.0 mg/dL   Nitrite, UA Negative Negative      Assessment & Plan:   Problem List Items Addressed This Visit      Respiratory   COPD, moderate ([place])    Stable and under good control, continue inhaler regimen. Not ready to quit smoking at this time        Endocrine   Hypothyroidism - Primary    Recheck TSH, adjust as needed. Continue current regimen in meantime  Relevant Orders   TSH (Completed)     Other   Tobacco abuse    Not ready to quit at this time due  to stressors      Major depressive disorder, single episode, in remission ([place])    Restart trintellix at lower dose, continue prn ativan for severe panic episodes.       Relevant Medications   LORazepam (ATIVAN) 0.5 MG tablet   vortioxetine HBr (TRINTELLIX) 10 MG TABS tablet    Other Visit Diagnoses    Annual physical exam       Relevant Orders   CBC with Differential/Platelet (Completed)   Comprehensive metabolic panel (Completed)   Lipid Panel w/o Chol/HDL Ratio (Completed)   UA/M w/rflx Culture, Routine (Completed)   Family history of cancer       Gastric cancer x 2 on maternal side. Will refer for genetic counseling.    Relevant Orders   Ambulatory referral to Genetics   Flu vaccine need       Relevant Orders   Flu Vaccine QUAD 36+ mos IM (Completed)   Screening for breast cancer       Relevant Orders   MM DIGITAL SCREENING BILATERAL       Follow up plan: Return in about 6 months (around 08/18/2019) for 6 month f/u.   LABORATORY TESTING:  - Pap smear: up to date  IMMUNIZATIONS:   - Tdap: Tetanus vaccination status reviewed: last tetanus booster within 10 years. - Influenza: Administered today  SCREENING: -Mammogram: Ordered today   PATIENT COUNSELING:   Advised to take 1 mg of folate supplement per day if capable of pregnancy.   Sexuality: Discussed sexually transmitted diseases, partner selection, use of condoms, avoidance of unintended pregnancy  and contraceptive alternatives.   Advised to avoid cigarette smoking.  I discussed with the patient that most people either abstain from alcohol or drink within safe limits (<=14/week and <=4 drinks/occasion for males, <=7/weeks and <= 3 drinks/occasion for females) and that the risk for alcohol disorders and other health effects rises proportionally with the number of drinks per week and how often a drinker exceeds daily limits.  Discussed cessation/primary prevention of drug use and availability of treatment for  abuse.   Diet: Encouraged to adjust caloric intake to maintain  or achieve ideal body weight, to reduce intake of dietary saturated fat and total fat, to limit sodium intake by avoiding high sodium foods and not adding table salt, and to maintain adequate dietary potassium and calcium preferably from fresh fruits, vegetables, and low-fat dairy products.    stressed the importance of regular exercise  Injury prevention: Discussed safety belts, safety helmets, smoke detector, smoking near bedding or upholstery.   Dental health: Discussed importance of regular tooth brushing, flossing, and dental visits.    NEXT PREVENTATIVE PHYSICAL DUE IN 1 YEAR. Return in about 6 months (around 08/18/2019) for 6 month f/u.

## 2019-02-18 NOTE — Telephone Encounter (Signed)
Requested medication (s) are due for refill today: yes  Requested medication (s) are on the active medication list: yes  Last refill:  01/19/2019  Future visit scheduled: yes  Notes to clinic: review for refill  Requested Prescriptions  Pending Prescriptions Disp Refills   VALTREX 500 MG tablet [Pharmacy Med Name: VALTREX TAB 500MG ] 34 tablet 9    Sig: TAKE 1 TABLET DAILY     Antimicrobials:  Antiviral Agents - Anti-Herpetic Passed - 02/18/2019  7:12 AM      Passed - Valid encounter within last 12 months    Recent Outpatient Visits          1 month ago Right elbow tendonitis   [facility] [place], [place]   4 months ago Panic disorder   [place], [place], [place]   6 months ago Transient alteration of awareness   [place], [place], [place]   6 months ago Panic disorder   [place], [place], [place]   7 months ago Major depressive disorder, single episode, in remission [facility])   [place], [place], [place]      Future Appointments            Today [place], [place], [place]

## 2019-02-19 LAB — COMPREHENSIVE METABOLIC PANEL
ALT: 8 IU/L (ref 0–32)
AST: 11 IU/L (ref 0–40)
Albumin/Globulin Ratio: 2 (ref 1.2–2.2)
Albumin: 4.5 g/dL (ref 3.8–4.8)
Alkaline Phosphatase: 50 IU/L (ref 39–117)
BUN/Creatinine Ratio: 8 — ABNORMAL LOW (ref 9–23)
BUN: 7 mg/dL (ref 6–24)
Bilirubin Total: 0.5 mg/dL (ref 0.0–1.2)
CO2: 25 mmol/L (ref 20–29)
Calcium: 9.6 mg/dL (ref 8.7–10.2)
Chloride: 104 mmol/L (ref 96–106)
Creatinine, Ser: 0.83 mg/dL (ref 0.57–1.00)
GFR calc Af Amer: 98 mL/min/{1.73_m2} (ref >59)
GFR calc non Af Amer: 85 mL/min/{1.73_m2} (ref >59)
Globulin, Total: 2.2 g/dL (ref 1.5–4.5)
Glucose: 95 mg/dL (ref 65–99)
Potassium: 4.1 mmol/L (ref 3.5–5.2)
Sodium: 142 mmol/L (ref 134–144)
Total Protein: 6.7 g/dL (ref 6.0–8.5)

## 2019-02-19 LAB — CBC WITH DIFFERENTIAL/PLATELET
Basophils Absolute: 0.1 10*3/uL (ref 0.0–0.2)
Basos: 1 %
EOS (ABSOLUTE): 0.2 10*3/uL (ref 0.0–0.4)
Eos: 2 %
Hematocrit: 41.4 % (ref 34.0–46.6)
Hemoglobin: 14.1 g/dL (ref 11.1–15.9)
Immature Grans (Abs): 0 10*3/uL (ref 0.0–0.1)
Immature Granulocytes: 0 %
Lymphocytes Absolute: 2.4 10*3/uL (ref 0.7–3.1)
Lymphs: 20 %
MCH: 31.9 pg (ref 26.6–33.0)
MCHC: 34.1 g/dL (ref 31.5–35.7)
MCV: 94 fL (ref 79–97)
Monocytes Absolute: 0.9 10*3/uL (ref 0.1–0.9)
Monocytes: 7 %
Neutrophils Absolute: 8.6 10*3/uL — ABNORMAL HIGH (ref 1.4–7.0)
Neutrophils: 70 %
Platelets: 313 10*3/uL (ref 150–450)
RBC: 4.42 x10E6/uL (ref 3.77–5.28)
RDW: 13.1 % (ref 11.7–15.4)
WBC: 12.2 10*3/uL — ABNORMAL HIGH (ref 3.4–10.8)

## 2019-02-19 LAB — LIPID PANEL W/O CHOL/HDL RATIO
Cholesterol, Total: 189 mg/dL (ref 100–199)
HDL: 53 mg/dL (ref >39)
LDL Chol Calc (NIH): 122 mg/dL — ABNORMAL HIGH (ref 0–99)
Triglycerides: 78 mg/dL (ref 0–149)
VLDL Cholesterol Cal: 14 mg/dL (ref 5–40)

## 2019-02-19 LAB — TSH: TSH: 6.95 u[IU]/mL — ABNORMAL HIGH (ref 0.450–4.500)

## 2019-02-23 ENCOUNTER — Other Ambulatory Visit: Payer: Self-pay | Admitting: Family Medicine

## 2019-02-23 DIAGNOSIS — E039 Hypothyroidism, unspecified: Secondary | ICD-10-CM

## 2019-02-23 MED ORDER — LEVOTHYROXINE SODIUM 175 MCG PO TABS: 175.0000 ug | ORAL_TABLET | Freq: Every day | ORAL | 0 refills | Status: DC

## 2019-02-24 NOTE — Assessment & Plan Note (Signed)
Restart trintellix at lower dose, continue prn ativan for severe panic episodes.

## 2019-02-24 NOTE — Assessment & Plan Note (Signed)
Not ready to quit at this time due to stressors

## 2019-02-24 NOTE — Assessment & Plan Note (Signed)
Recheck TSH, adjust as needed. Continue current regimen in meantime 

## 2019-02-24 NOTE — Assessment & Plan Note (Signed)
Stable and under good control, continue inhaler regimen. Not ready to quit smoking at this time

## 2019-03-21 ENCOUNTER — Telehealth: Payer: Self-pay | Admitting: Family Medicine

## 2019-03-21 NOTE — Telephone Encounter (Signed)
Needs appt

## 2019-03-22 ENCOUNTER — Ambulatory Visit (INDEPENDENT_AMBULATORY_CARE_PROVIDER_SITE_OTHER): Payer: BC Managed Care – PPO | Admitting: Family Medicine

## 2019-03-22 ENCOUNTER — Other Ambulatory Visit: Payer: Self-pay

## 2019-03-22 ENCOUNTER — Encounter: Payer: Self-pay | Admitting: Family Medicine

## 2019-03-22 VITALS — Temp 98.0°F | Ht 64.0 in | Wt 158.0 lb

## 2019-03-22 DIAGNOSIS — N390 Urinary tract infection, site not specified: Secondary | ICD-10-CM

## 2019-03-22 DIAGNOSIS — J449 Chronic obstructive pulmonary disease, unspecified: Secondary | ICD-10-CM

## 2019-03-22 MED ORDER — NITROFURANTOIN MONOHYD MACRO 100 MG PO CAPS: 100.0000 mg | ORAL_CAPSULE | Freq: Two times a day (BID) | ORAL | 0 refills | Status: DC

## 2019-03-22 MED ORDER — PHENAZOPYRIDINE HCL 200 MG PO TABS: 200.0000 mg | ORAL_TABLET | Freq: Three times a day (TID) | ORAL | 0 refills | Status: DC | PRN

## 2019-03-22 MED ORDER — SPIRIVA HANDIHALER 18 MCG IN CAPS: 18.0000 ug | ORAL_CAPSULE | Freq: Every day | RESPIRATORY_TRACT | 12 refills | Status: DC

## 2019-03-22 MED ORDER — ALBUTEROL SULFATE HFA 108 (90 BASE) MCG/ACT IN AERS: 2.0000 | INHALATION_SPRAY | Freq: Four times a day (QID) | RESPIRATORY_TRACT | 2 refills | Status: AC | PRN

## 2019-03-22 NOTE — Progress Notes (Signed)
Temp 98 F (36.7 C) (Temporal)   Ht 5\' 4"  (1.626 m)   Wt 158 lb (71.7 kg)   BMI 27.12 kg/m    Subjective:    [name], female    DOB: [birth year], 43 y.o.   MRN: [id number]  [name] is a 43 y.o. female  Chief Complaint  Patient presents with  . Dysuria    Symptoms ongoing 4 days. Patient states she hasn't slept because she's going to the restroom every 5mins-1hr  . Urinary Frequency  . Nocturia  . Fatigue    . This visit was completed via WebEx due to the restrictions of the COVID-19 pandemic. All issues as above were discussed and addressed. Physical exam was done as above through visual confirmation on WebEx. If it was felt that the patient should be evaluated in the office, they were directed there. The patient verbally consented to this visit. . Location of the patient: home . Location of the provider: home . Those involved with this call:  . Provider: Merrie Roof, PA-C . CMA: Merilyn Baba, [place] . Front Desk/Registration: Jill Side  . Time spent on call: 20 minutes with patient face to face via video conference. More than 50% of this time was spent in counseling and coordination of care. 5 minutes total spent in review of patient's record and preparation of their chart. I verified patient identity using two factors (patient name and date of birth). Patient consents verbally to being seen via telemedicine visit today.   Patient presenting today with 4 days of significant urinary frequency, dysuria, malaise. Has not slept much at night because she's waking to urinate every 30 minutes. Denies fevers, chills, back pain, N/V/D. Taking AZO with minimal relief.   Also states she would like to switch from anoro back to spiriva as it seemed to work better for her. Was only on anoro because she was able to get it free from work but she has since stopped working for that company.   Relevant past medical, surgical, family and social history reviewed  and updated as indicated. Interim medical history since our last visit reviewed. Allergies and medications reviewed and updated.  Review of Systems  Per HPI unless specifically indicated above     Objective:    Temp 98 F (36.7 C) (Temporal)   Ht 5\' 4"  (1.626 m)   Wt 158 lb (71.7 kg)   BMI 27.12 kg/m   Wt Readings from Last 3 Encounters:  03/22/19 158 lb (71.7 kg)  02/18/19 160 lb (72.6 kg)  01/18/19 160 lb (72.6 kg)    Physical Exam Vitals signs and nursing note reviewed.  Constitutional:      General: She is not in acute distress.    Appearance: Normal appearance.  HENT:     Head: Atraumatic.     Right Ear: External ear normal.     Left Ear: External ear normal.     Nose: Nose normal. No congestion.     Mouth/Throat:     Mouth: Mucous membranes are moist.     Pharynx: Oropharynx is clear. No posterior oropharyngeal erythema.  Eyes:     Extraocular Movements: Extraocular movements intact.     Conjunctiva/sclera: Conjunctivae normal.  Neck:     Musculoskeletal: Normal range of motion.  Cardiovascular:     Comments: Unable to assess via virtual visit Pulmonary:     Effort: Pulmonary effort is normal. No respiratory distress.  Abdominal:     Comments: Abdomen non  tender per patient's self palpation  Musculoskeletal: Normal range of motion.  Skin:    General: Skin is dry.     Findings: No erythema.  Neurological:     Mental Status: She is alert and oriented to person, place, and time.  Psychiatric:        Mood and Affect: Mood normal.        Thought Content: Thought content normal.        Judgment: Judgment normal.     Results for orders placed or performed in visit on 02/18/19  CBC with Differential/Platelet  Result Value Ref Range   WBC 12.2 (H) 3.4 - 10.8 x10E3/uL   RBC 4.42 3.77 - 5.28 x10E6/uL   Hemoglobin 14.1 11.1 - 15.9 g/dL   Hematocrit 41.4 34.0 - 46.6 %   MCV 94 79 - 97 fL   MCH 31.9 26.6 - 33.0 pg   MCHC 34.1 31.5 - 35.7 g/dL   RDW 13.1  11.7 - 15.4 %   Platelets 313 150 - 450 x10E3/uL   Neutrophils 70 Not Estab. %   Lymphs 20 Not Estab. %   Monocytes 7 Not Estab. %   Eos 2 Not Estab. %   Basos 1 Not Estab. %   Neutrophils Absolute 8.6 (H) 1.4 - 7.0 x10E3/uL   Lymphocytes Absolute 2.4 0.7 - 3.1 x10E3/uL   Monocytes Absolute 0.9 0.1 - 0.9 x10E3/uL   EOS (ABSOLUTE) 0.2 0.0 - 0.4 x10E3/uL   Basophils Absolute 0.1 0.0 - 0.2 x10E3/uL   Immature Granulocytes 0 Not Estab. %   Immature Grans (Abs) 0.0 0.0 - 0.1 x10E3/uL  Comprehensive metabolic panel  Result Value Ref Range   Glucose 95 65 - 99 mg/dL   BUN 7 6 - 24 mg/dL   Creatinine, Ser 0.83 0.57 - 1.00 mg/dL   GFR calc non Af Amer 85 >59 mL/min/1.73   GFR calc Af Amer 98 >59 mL/min/1.73   BUN/Creatinine Ratio 8 (L) 9 - 23   Sodium 142 134 - 144 mmol/L   Potassium 4.1 3.5 - 5.2 mmol/L   Chloride 104 96 - 106 mmol/L   CO2 25 20 - 29 mmol/L   Calcium 9.6 8.7 - 10.2 mg/dL   Total Protein 6.7 6.0 - 8.5 g/dL   Albumin 4.5 3.8 - 4.8 g/dL   Globulin, Total 2.2 1.5 - 4.5 g/dL   Albumin/Globulin Ratio 2.0 1.2 - 2.2   Bilirubin Total 0.5 0.0 - 1.2 mg/dL   Alkaline Phosphatase 50 39 - 117 IU/L   AST 11 0 - 40 IU/L   ALT 8 0 - 32 IU/L  Lipid Panel w/o Chol/HDL Ratio  Result Value Ref Range   Cholesterol, Total 189 100 - 199 mg/dL   Triglycerides 78 0 - 149 mg/dL   HDL 53 >39 mg/dL   VLDL Cholesterol Cal 14 5 - 40 mg/dL   LDL Chol Calc (NIH) 122 (H) 0 - 99 mg/dL  TSH  Result Value Ref Range   TSH 6.950 (H) 0.450 - 4.500 uIU/mL  UA/M w/rflx Culture, Routine   Specimen: Urine   URINE  Result Value Ref Range   Specific Gravity, UA 1.015 1.005 - 1.030   pH, UA 5.5 5.0 - 7.5   Color, UA Yellow Yellow   Appearance Ur Clear Clear   Leukocytes,UA Negative Negative   Protein,UA Negative Negative/Trace   Glucose, UA Negative Negative   Ketones, UA Negative Negative   RBC, UA Negative Negative   Bilirubin, UA Negative Negative  Urobilinogen, Ur 1.0 0.2 - 1.0 mg/dL    Nitrite, UA Negative Negative      Assessment & Plan:   Problem List Items Addressed This Visit      Respiratory   COPD, moderate ([place])    Will switch back to spiriva due to efficacy for patient. Continue current regimen      Relevant Medications   tiotropium (SPIRIVA HANDIHALER) 18 MCG inhalation capsule   albuterol (VENTOLIN HFA) 108 (90 Base) MCG/ACT inhaler    Other Visit Diagnoses    Acute lower UTI    -  Primary   Tx with macrobid, await cx. Urine was brought to lab in non-sterile container so may require repeat with sterile sample if not improving   Relevant Medications   nitrofurantoin, macrocrystal-monohydrate, (MACROBID) 100 MG capsule   phenazopyridine (PYRIDIUM) 200 MG tablet   Other Relevant Orders   UA/M w/rflx Culture, Routine       Follow up plan: Return for as scheduled.

## 2019-03-22 NOTE — Assessment & Plan Note (Signed)
Will switch back to spiriva due to efficacy for patient. Continue current regimen

## 2019-03-23 ENCOUNTER — Ambulatory Visit: Payer: PRIVATE HEALTH INSURANCE | Admitting: Family Medicine

## 2019-03-30 ENCOUNTER — Telehealth: Payer: Self-pay | Admitting: Family Medicine

## 2019-03-30 LAB — UA/M W/RFLX CULTURE, ROUTINE
Bilirubin, UA: NEGATIVE
Glucose, UA: NEGATIVE
Ketones, UA: NEGATIVE
Nitrite, UA: POSITIVE — AB
Protein,UA: NEGATIVE
Specific Gravity, UA: 1.015 (ref 1.005–1.030)
Urobilinogen, Ur: 0.2 mg/dL (ref 0.2–1.0)
pH, UA: 5.5 (ref 5.0–7.5)

## 2019-03-30 LAB — URINE CULTURE

## 2019-03-30 LAB — MICROSCOPIC EXAMINATION

## 2019-03-30 LAB — URINE CULTURE, REFLEX

## 2019-03-30 NOTE — Telephone Encounter (Signed)
Called [facility] back. They were just calling to let us know that they will be sending the corrected report, nothing needs to be done from [place].

## 2019-03-30 NOTE — Telephone Encounter (Signed)
What am I advising? Please have them send directly the corrected reports so I can let patient know  Copied from [place] [phone]. Topic: General - Other >> Mar 29, 2019  3:05 PM Celene Kras A wrote: Reason for CRM: katherine, from [place], called stating they made a mistake and the labs would be able to be processed and report will be corrected. Please advise.   [phone]

## 2019-04-07 MED ORDER — DOXYCYCLINE HYCLATE 100 MG PO TABS: 100.0000 mg | ORAL_TABLET | Freq: Two times a day (BID) | ORAL | 0 refills | Status: DC

## 2019-04-07 NOTE — Telephone Encounter (Signed)
Called and left a message letting patient know Rachel's response.

## 2019-04-07 NOTE — Telephone Encounter (Signed)
Please let her know I sent over another antibiotic for her. She will need to f/u for recheck if not improving

## 2019-05-31 ENCOUNTER — Other Ambulatory Visit: Payer: Self-pay

## 2019-05-31 NOTE — Telephone Encounter (Signed)
Patient last seen 02/18/19

## 2019-06-01 MED ORDER — LORAZEPAM 0.5 MG PO TABS: 0.5000 mg | ORAL_TABLET | Freq: Every day | ORAL | 0 refills | Status: DC | PRN

## 2019-08-31 ENCOUNTER — Ambulatory Visit (INDEPENDENT_AMBULATORY_CARE_PROVIDER_SITE_OTHER)
Admission: RE | Admit: 2019-08-31 | Discharge: 2019-08-31 | Disposition: A | Discharge status: Home / Self Care | Payer: 59 | Source: Ambulatory Visit

## 2019-08-31 DIAGNOSIS — R300 Dysuria: Secondary | ICD-10-CM

## 2019-08-31 DIAGNOSIS — B9689 Other specified bacterial agents as the cause of diseases classified elsewhere: Secondary | ICD-10-CM

## 2019-08-31 DIAGNOSIS — N390 Urinary tract infection, site not specified: Secondary | ICD-10-CM

## 2019-08-31 MED ORDER — NITROFURANTOIN MONOHYD MACRO 100 MG PO CAPS: 100.0000 mg | ORAL_CAPSULE | Freq: Two times a day (BID) | ORAL | 0 refills | Status: DC

## 2019-08-31 NOTE — Discharge Instructions (Signed)
Treating you for a UTI Follow up as needed for continued or worsening symptoms

## 2019-08-31 NOTE — ED Provider Notes (Signed)
Virtual Visit via Video Note:  Meagan Savage  initiated request for Telemedicine visit with Meagan Savage. I connected with Meagan Savage  on 08/31/2019 at 11:16 AM  for a synchronized telemedicine visit using a video enabled HIPPA compliant telemedicine application. I verified that I am speaking with Meagan Savage  using two identifiers. Meagan July, NP  was physically located in a Meagan Savage and Meagan Savage was located at a different location.   The limitations of evaluation and management by telemedicine as well as the availability of in-person appointments were discussed. Patient was informed that she  may incur a bill ( including co-pay) for this virtual visit encounter. Meagan Savage  expressed understanding and gave verbal consent to proceed with virtual visit.     History of Present Illness:Meagan Savage  is a 44 y.o. female presents with approximately 3 to 4 days of dysuria, urinary retention and frequency.  Symptoms been constant.  History of similar to previous UTIs.  History of recurrent UTIs.  Mild lower abdominal discomfort and lower back pain.  No flank pain, nausea, vomiting or fevers.  Currently has IUD No vaginal discharge, itching or irritation.  Past Medical History:  Diagnosis Date  . Abdominal pain   . Acute foot pain   . Anxiety   . Asthma   . COPD (chronic obstructive pulmonary disease) ([place])   . Cough   . Cyst, dermoid, face   . Dizziness   . Fatigue   . Goiter   . Hashimoto's disease   . Hypothyroidism   . Microscopic hematuria   . Nevus   . Pain and swelling of right knee   . Shortness of breath at rest   . Thyroid disease    hypothyroid  . Urinary frequency   . Vitamin D deficiency     Allergies  Allergen Reactions  . Bee Venom Anaphylaxis  . Chantix [Varenicline] Nausea Only  . Flagyl [Metronidazole] Hives  . Levaquin [Levofloxacin In D5w] Other (See Comments)    arthralgia  . Penicillins Hives         Observations/Objective:VITALS: Per patient if applicable, see vitals. GENERAL: Alert, appears well and in no acute distress. HEENT: Atraumatic, conjunctiva clear, no obvious abnormalities on inspection of external nose and ears. NECK: Normal movements of the head and neck. CARDIOPULMONARY: No increased WOB. Speaking in clear sentences. I:E ratio WNL.  MS: Moves all visible extremities without noticeable abnormality. PSYCH: Pleasant and cooperative, well-groomed. Speech normal rate and rhythm. Affect is appropriate. Insight and judgement are appropriate. Attention is focused, linear, and appropriate.  NEURO: CN grossly intact. Oriented as arrived to appointment on time with no prompting. Moves both UE equally.  SKIN: No obvious lesions, wounds, erythema, or cyanosis noted on face or hands.     Assessment and Plan: Recurrent UTI-treating with Macrobid   Follow Up Instructions: Recommend if symptoms continue or worsen she will need to come in for urine sample and culture    I discussed the assessment and treatment plan with the patient. The patient was provided an opportunity to ask questions and all were answered. The patient agreed with the plan and demonstrated an understanding of the instructions.   The patient was advised to call back or seek an in-person evaluation if the symptoms worsen or if the condition fails to improve as anticipated.    Meagan July, NP  08/31/2019 11:16 AM         Meagan Savage, Meagan Galas,  NP 08/31/19 1431

## 2019-12-04 ENCOUNTER — Other Ambulatory Visit: Payer: Self-pay | Admitting: Family Medicine

## 2019-12-04 NOTE — Telephone Encounter (Signed)
Please advise pt overdue for appointment- 30 day courtesy RF- no further RF until seen by PCP Requested Prescriptions  Pending Prescriptions Disp Refills   TRINTELLIX 10 MG TABS tablet [Pharmacy Med Name: TRINTELLIX 10MG  TB (WAS BRINTELLIX)] 30 tablet 0    Sig: TAKE 1 TABLET(10 MG) BY MOUTH DAILY     Psychiatry: Antidepressants - Serotonin Modulator Failed - 12/04/2019 10:10 AM      Failed - Valid encounter within last 6 months    Recent Outpatient Visits          8 months ago Acute lower UTI   [facility] [place], [place]   9 months ago Hypothyroidism, unspecified type   [place], [place], [place]   10 months ago Right elbow tendonitis   [facility] [place], [place]   1 year ago Panic disorder   [facility] [place], [place]   1 year ago Transient alteration of awareness   [place], [place], [place]             Passed - Completed PHQ-2 or PHQ-9 in the last 360 days.

## 2019-12-12 IMAGING — DX DG CHEST 2V
2 series · 2 of 2 positions shown · non-contrast
Comparison: None.

CLINICAL DATA: Productive chronic cough.

EXAM:
CHEST - 2 VIEW

[chest pa]
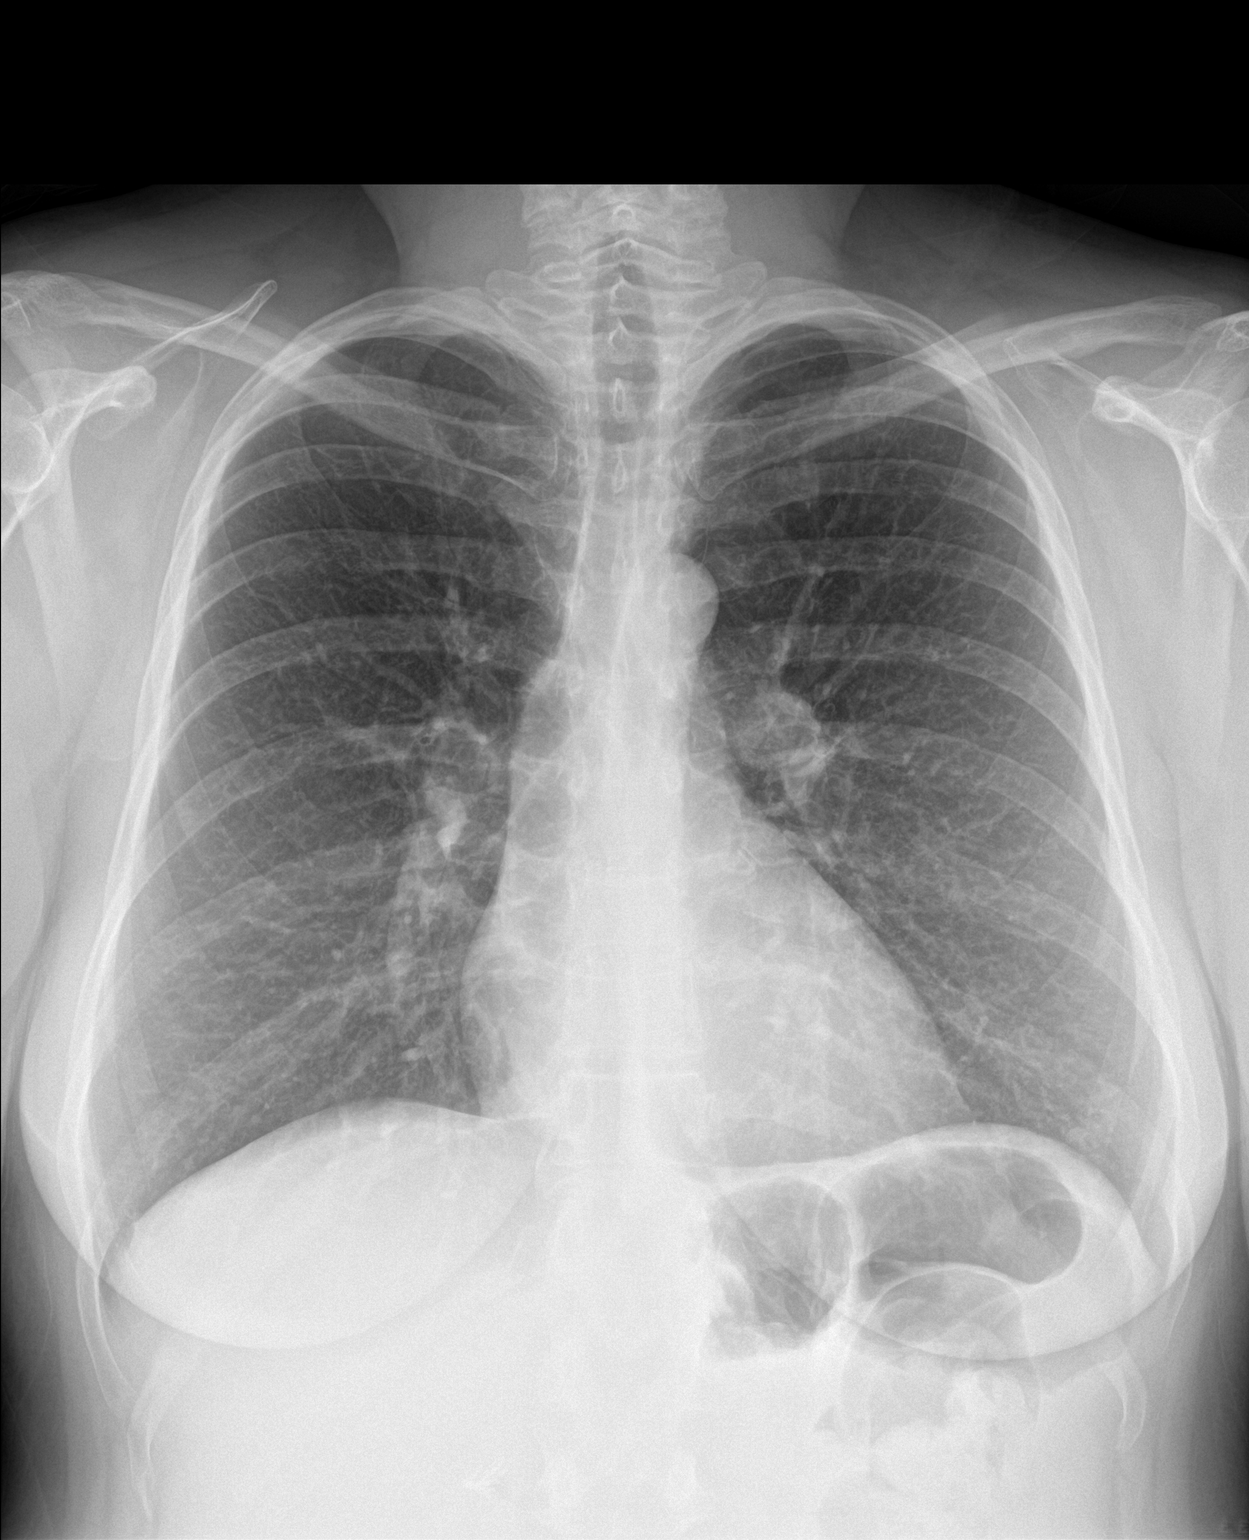

[chest lat]
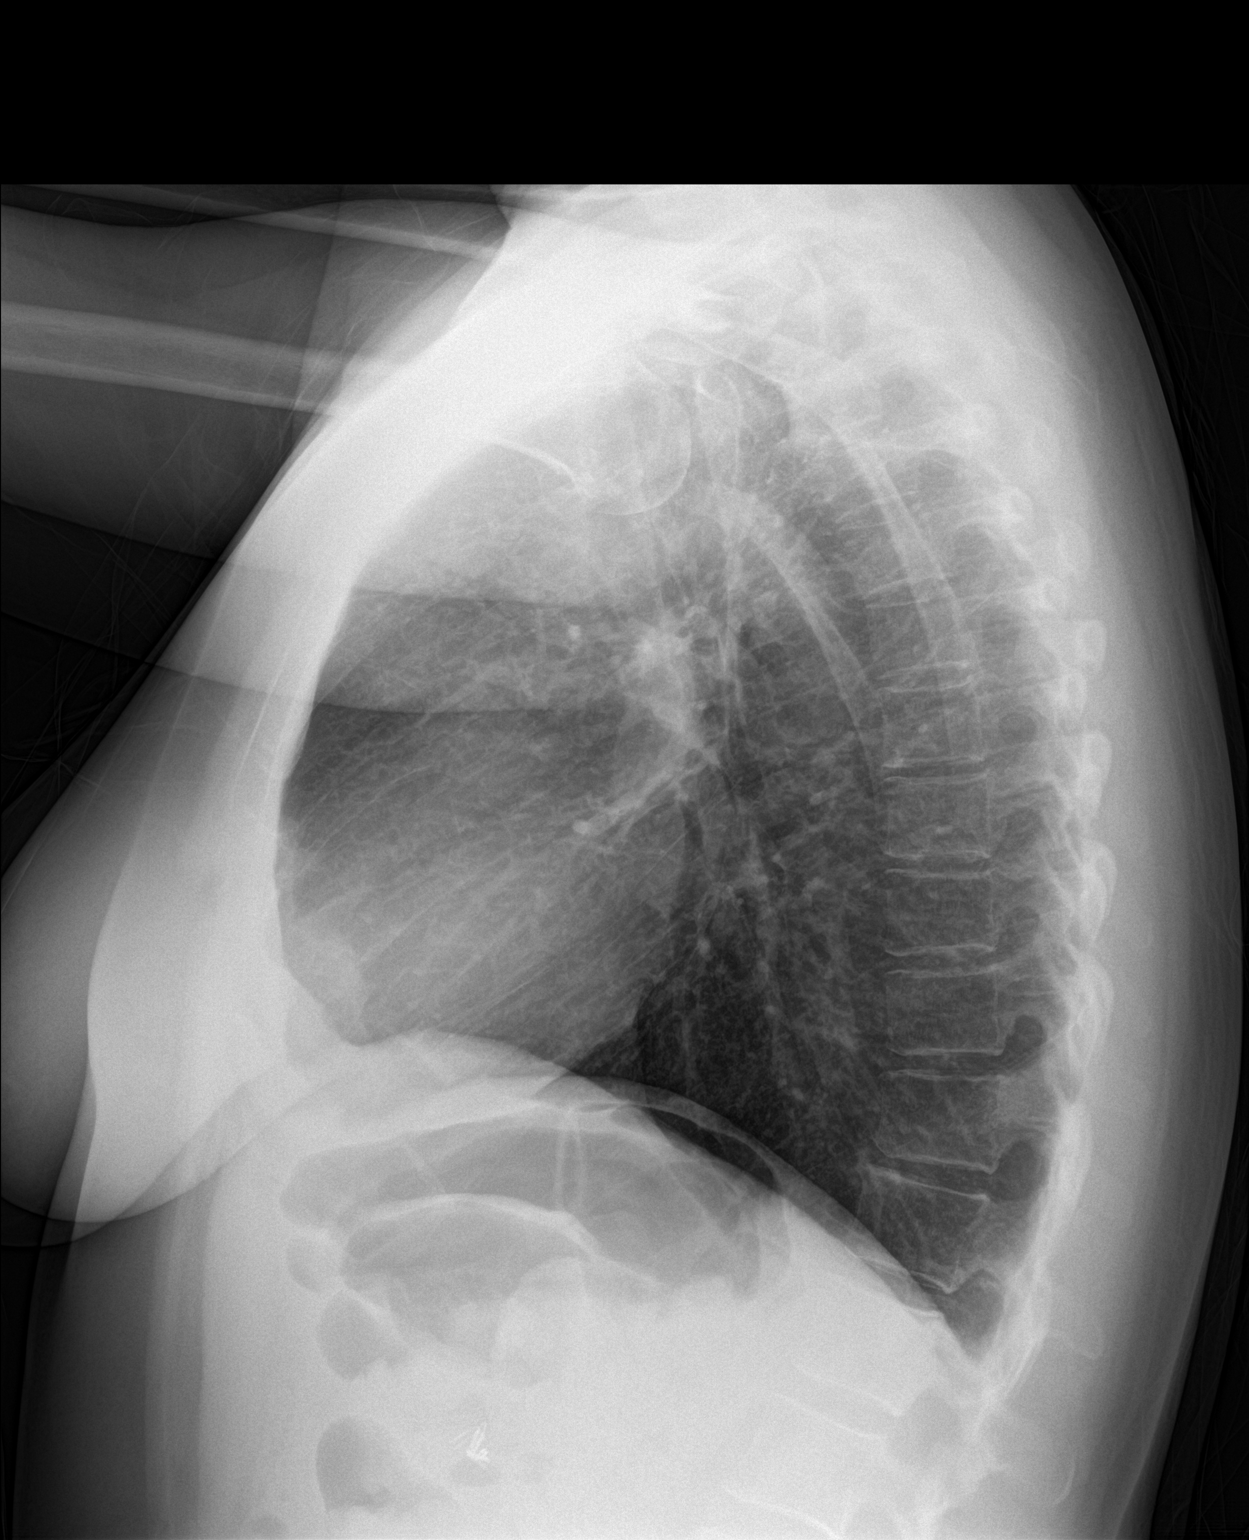

[2 of 2 positions shown; findings below may reference images not displayed]

FINDINGS: The heart size and mediastinal contours are within normal limits.
Both lungs are clear. The visualized skeletal structures are
unremarkable. Prior cholecystectomy clips are noted.
IMPRESSION: No active cardiopulmonary disease.

## 2020-02-01 ENCOUNTER — Other Ambulatory Visit: Payer: Self-pay

## 2020-02-01 ENCOUNTER — Ambulatory Visit (INDEPENDENT_AMBULATORY_CARE_PROVIDER_SITE_OTHER): Payer: Commercial Managed Care - PPO | Admitting: Family Medicine

## 2020-02-01 ENCOUNTER — Encounter: Payer: Self-pay | Admitting: Family Medicine

## 2020-02-01 VITALS — BP 133/93 | HR 79 | Temp 98.8°F | Wt 186.8 lb

## 2020-02-01 DIAGNOSIS — E039 Hypothyroidism, unspecified: Secondary | ICD-10-CM | POA: Diagnosis not present

## 2020-02-01 DIAGNOSIS — F325 Major depressive disorder, single episode, in full remission: Secondary | ICD-10-CM | POA: Diagnosis not present

## 2020-02-01 DIAGNOSIS — F410 Panic disorder [episodic paroxysmal anxiety] without agoraphobia: Secondary | ICD-10-CM | POA: Diagnosis not present

## 2020-02-01 DIAGNOSIS — J449 Chronic obstructive pulmonary disease, unspecified: Secondary | ICD-10-CM

## 2020-02-01 MED ORDER — SPIRIVA HANDIHALER 18 MCG IN CAPS: 18.0000 ug | ORAL_CAPSULE | Freq: Every day | RESPIRATORY_TRACT | 12 refills | Status: AC

## 2020-02-01 MED ORDER — LORAZEPAM 0.5 MG PO TABS: 0.5000 mg | ORAL_TABLET | Freq: Every day | ORAL | 0 refills | Status: AC | PRN

## 2020-02-01 MED ORDER — LEVOTHYROXINE SODIUM 175 MCG PO TABS: 175.0000 ug | ORAL_TABLET | Freq: Every day | ORAL | 0 refills | Status: AC

## 2020-02-01 MED ORDER — VORTIOXETINE HBR 10 MG PO TABS: ORAL_TABLET | ORAL | 0 refills | Status: AC

## 2020-02-01 NOTE — Progress Notes (Signed)
BP (!) 133/93 (BP Location: Right Arm, Patient Position: Sitting, Cuff Size: Normal)   Pulse 79   Temp 98.8 F (37.1 C) (Oral)   Wt 186 lb 12.8 oz (84.7 kg)   SpO2 97%   BMI 32.06 kg/m    Subjective:    [name], female    DOB: 10-02-1975, 44 y.o.   MRN: [id number]  [name] is a 44 y.o. female  Chief Complaint  Patient presents with  . Medication Refill  . Follow-up   Here today for f/u chronic conditions after being lost to f/u for nearly 1 year. Has been off all medications for about 2-3 months now.   Anxiety and depression - wanting to restart trintellix at 10 mg dose. Has had poor tolerance to higher dose in the past. Feels moods and anxiety are a bit better controlled now that she's moved. Previously on ativan prn for panic attacks.   COPD - previously well controlled on spiriva. No recent exacerbations.   Has been off synthroid for several months now. Previously well tolerated.   Relevant past medical, surgical, family and social history reviewed and updated as indicated. Interim medical history since our last visit reviewed. Allergies and medications reviewed and updated.  Review of Systems  Per HPI unless specifically indicated above     Objective:    BP (!) 133/93 (BP Location: Right Arm, Patient Position: Sitting, Cuff Size: Normal)   Pulse 79   Temp 98.8 F (37.1 C) (Oral)   Wt 186 lb 12.8 oz (84.7 kg)   SpO2 97%   BMI 32.06 kg/m   Wt Readings from Last 3 Encounters:  02/01/20 186 lb 12.8 oz (84.7 kg)  03/22/19 158 lb (71.7 kg)  02/18/19 160 lb (72.6 kg)    Physical Exam Vitals and nursing note reviewed.  Constitutional:      Appearance: Normal appearance. She is not ill-appearing.  HENT:     Head: Atraumatic.  Eyes:     Extraocular Movements: Extraocular movements intact.     Conjunctiva/sclera: Conjunctivae normal.  Cardiovascular:     Rate and Rhythm: Normal rate and regular rhythm.     Heart sounds: Normal  heart sounds.  Pulmonary:     Effort: Pulmonary effort is normal.     Breath sounds: Normal breath sounds.  Musculoskeletal:        General: Normal range of motion.     Cervical back: Normal range of motion and neck supple.  Skin:    General: Skin is warm and dry.  Neurological:     Mental Status: She is alert and oriented to person, place, and time.  Psychiatric:        Mood and Affect: Mood normal.        Thought Content: Thought content normal.        Judgment: Judgment normal.     Results for orders placed or performed in visit on 03/22/19  Microscopic Examination   URINE  Result Value Ref Range   WBC, UA 6-10 (A) 0 - 5 /hpf   RBC 0-2 0 - 2 /hpf   Epithelial Cells (non renal) 0-10 0 - 10 /hpf   Bacteria, UA Many (A) None seen/Few  Urine Culture, Reflex   URINE  Result Value Ref Range   Urine Culture, Routine CANCELED   Urine Culture  Result Value Ref Range   Urine Culture, Routine Final report (A)    Organism ID, Bacteria Escherichia coli (A)  Antimicrobial Susceptibility Comment   UA/M w/rflx Culture, Routine   Specimen: Urine   URINE  Result Value Ref Range   Specific Gravity, UA 1.015 1.005 - 1.030   pH, UA 5.5 5.0 - 7.5   Color, UA Yellow Yellow   Appearance Ur Clear Clear   Leukocytes,UA 1+ (A) Negative   Protein,UA Negative Negative/Trace   Glucose, UA Negative Negative   Ketones, UA Negative Negative   RBC, UA Trace (A) Negative   Bilirubin, UA Negative Negative   Urobilinogen, Ur 0.2 0.2 - 1.0 mg/dL   Nitrite, UA Positive (A) Negative   Microscopic Examination See below:    Urinalysis Reflex Comment       Assessment & Plan:   Problem List Items Addressed This Visit      Respiratory   COPD, moderate (HCC)   Relevant Medications   tiotropium (SPIRIVA HANDIHALER) 18 MCG inhalation capsule     Endocrine   Hypothyroidism - Primary    Restart synthroid, future order for TSH placed in case she is unable to establish care in her new town in the  next 4-6 weeks      Relevant Medications   levothyroxine (SYNTHROID) 175 MCG tablet   Other Relevant Orders   TSH     Other   Major depressive disorder, single episode, in remission ([place])    Restart trintellix, monitor for benefit. F/u 4-6 weeks with new PCP in new town      Relevant Medications   vortioxetine HBr (TRINTELLIX) 10 MG TABS tablet   LORazepam (ATIVAN) 0.5 MG tablet   Panic disorder    Restart trintellix, small script for prn ativan given for rare prn use. This should last until she is able to establish locally to her new property for care      Relevant Medications   vortioxetine HBr (TRINTELLIX) 10 MG TABS tablet   LORazepam (ATIVAN) 0.5 MG tablet       Follow up plan: Return in about 6 weeks (around 03/14/2020) for if not established with new PCP yet.

## 2020-02-07 NOTE — Assessment & Plan Note (Signed)
Restart synthroid, future order for TSH placed in case she is unable to establish care in her new town in the next 4-6 weeks

## 2020-02-07 NOTE — Assessment & Plan Note (Signed)
Restart trintellix, small script for prn ativan given for rare prn use. This should last until she is able to establish locally to her new property for care

## 2020-02-07 NOTE — Assessment & Plan Note (Signed)
Restart trintellix, monitor for benefit. F/u 4-6 weeks with new PCP in new town

## 2020-04-28 ENCOUNTER — Telehealth: Payer: Self-pay | Admitting: Family Medicine

## 2020-04-28 NOTE — Telephone Encounter (Signed)
Requested medication (s) are due for refill today: yes  Requested medication (s) are on the active medication list: yes  Last refill: 02/01/2020  Future visit scheduled: no  Notes to clinic: Medication last filled by [facility] Patient hasn't seen another provider    Requested Prescriptions  Pending Prescriptions Disp Refills   levothyroxine (SYNTHROID) 175 MCG tablet [Pharmacy Med Name: LEVOTHYROXINE 0.175MG  (175MCG) TABS] 90 tablet 0    Sig: TAKE 1 TABLET(175 MCG) BY MOUTH DAILY BEFORE BREAKFAST      Endocrinology:  Hypothyroid Agents Failed - 04/28/2020  3:19 AM      Failed - TSH needs to be rechecked within 3 months after an abnormal result. Refill until TSH is due.      Failed - TSH in normal range and within 360 days    TSH  Date Value Ref Range Status  02/18/2019 6.950 (H) 0.450 - 4.500 uIU/mL Final          Passed - Valid encounter within last 12 months    Recent Outpatient Visits           2 months ago Hypothyroidism, unspecified type   [facility] [place], [place]   1 year ago Acute lower UTI   [facility] [place], [place]   1 year ago Hypothyroidism, unspecified type   [facility] [place], [place]   1 year ago Right elbow tendonitis   [facility] [place], [place]   1 year ago Panic disorder   [facility] [place], [place]

## 2020-04-30 NOTE — Telephone Encounter (Signed)
Called patient to verify if established with new PCP, per  last visit note was seeking for a new PCP. No future appointment at [facility].

## 2020-04-30 NOTE — Telephone Encounter (Signed)
Attempted to call patient to verify if established with new PCP, according to last visit note was seeking for a new PCP. No future appointment set here.  See P[facility] note below.

## 2020-05-01 ENCOUNTER — Encounter: Payer: Self-pay | Admitting: Family Medicine

## 2020-05-01 NOTE — Telephone Encounter (Signed)
Letter sent.

## 2020-05-01 NOTE — Telephone Encounter (Signed)
3rd attempt Called patient to verify if established with new PCP, per  last visit note was seeking for a new PCP. No future appointment at [facility]

## 2020-05-01 NOTE — Telephone Encounter (Signed)
Patient is returning a call.  She stated that she has moved out of the area and will no longer be a patient of [facility]

## 2020-05-03 ENCOUNTER — Other Ambulatory Visit: Payer: Self-pay | Admitting: Family Medicine
